# Patient Record
Sex: Male | Born: 1975 | Race: White | Marital: Married | Smoking: Former smoker
Health system: Northeastern US, Academic
[De-identification: ages and names within clinical notes are randomized; demographics above are authoritative.]

## PROBLEM LIST (undated history)

## (undated) DIAGNOSIS — G809 Cerebral palsy, unspecified: Secondary | ICD-10-CM

## (undated) DIAGNOSIS — M1990 Unspecified osteoarthritis, unspecified site: Secondary | ICD-10-CM

## (undated) DIAGNOSIS — K219 Gastro-esophageal reflux disease without esophagitis: Secondary | ICD-10-CM

## (undated) DIAGNOSIS — E785 Hyperlipidemia, unspecified: Secondary | ICD-10-CM

## (undated) DIAGNOSIS — J45909 Unspecified asthma, uncomplicated: Secondary | ICD-10-CM

## (undated) DIAGNOSIS — Z9889 Other specified postprocedural states: Secondary | ICD-10-CM

## (undated) DIAGNOSIS — G8929 Other chronic pain: Secondary | ICD-10-CM

## (undated) DIAGNOSIS — R112 Nausea with vomiting, unspecified: Secondary | ICD-10-CM

## (undated) HISTORY — PX: PYELOPLASTY: SUR1061

## (undated) HISTORY — PX: LEG SURGERY: SHX1003

## (undated) HISTORY — PX: KIDNEY SURGERY: SHX687

## (undated) HISTORY — DX: Unspecified asthma, uncomplicated: J45.909

## (undated) HISTORY — DX: Unspecified osteoarthritis, unspecified site: M19.90

## (undated) HISTORY — DX: Gastro-esophageal reflux disease without esophagitis: K21.9

## (undated) HISTORY — DX: Hyperlipidemia, unspecified: E78.5

## (undated) HISTORY — PX: HIP REPLACEMENT: SHX530A

---

## 2011-01-29 HISTORY — PX: KIDNEY SURGERY: SHX687

## 2014-08-08 ENCOUNTER — Emergency Department
Admission: EM | Admit: 2014-08-08 | Discharge: 2014-08-08 | Disposition: A | Discharge status: Home / Self Care | Payer: BLUE CROSS/BLUE SHIELD | Attending: Emergency Medicine | Admitting: Emergency Medicine

## 2014-08-08 ENCOUNTER — Encounter: Payer: Self-pay | Admitting: *Deleted

## 2014-08-08 DIAGNOSIS — Z87891 Personal history of nicotine dependence: Secondary | ICD-10-CM | POA: Diagnosis not present

## 2014-08-08 DIAGNOSIS — G809 Cerebral palsy, unspecified: Secondary | ICD-10-CM | POA: Insufficient documentation

## 2014-08-08 DIAGNOSIS — Z79899 Other long term (current) drug therapy: Secondary | ICD-10-CM | POA: Diagnosis not present

## 2014-08-08 DIAGNOSIS — M25551 Pain in right hip: Secondary | ICD-10-CM | POA: Diagnosis present

## 2014-08-08 HISTORY — DX: Unspecified osteoarthritis, unspecified site: M19.90

## 2014-08-08 HISTORY — DX: Cerebral palsy, unspecified: G80.9

## 2014-08-08 HISTORY — DX: Other chronic pain: G89.29

## 2014-08-08 MED ORDER — OXYCODONE-ACETAMINOPHEN 5-325 MG PO TABS
2.0000 | ORAL_TABLET | Freq: Once | ORAL | Status: AC
  Administered 2014-08-08: 2 via ORAL
  Filled 2014-08-08: qty 2

## 2014-08-08 MED ORDER — OXYCODONE-ACETAMINOPHEN 5-325 MG PO TABS: 1.0000 | ORAL_TABLET | ORAL | Status: DC | PRN

## 2014-08-08 NOTE — ED Notes (Signed)
Pt admits to hx of osteoarthritis and chronic pain to hip joints - pt has been experiencing acute on chronic pain exacerbation since Saturday, pt states home pain medications have not been effective. Denies any mechanism of injury.

## 2014-08-08 NOTE — ED Notes (Signed)
Colton MassedKaminski, MD at bedside.

## 2014-08-08 NOTE — ED Provider Notes (Signed)
[facility] Emergency Department Provider Note  ____________________________________________  Time seen: 2225  I have reviewed the triage vital signs and the nursing notes.   HISTORY  Chief Complaint Hip Pain     HPI Colton [name] is a 39 y.o. male who was born with cerebral palsy. He has had a lifelong problem with some of his musculoskeletal issues. This included surgeries or very young age on his right leg. He has developed osteoarthritis and has a chronic pain problem. He goes to Dr. Ricardo Jericho, the physiatrist at [facility] and he has seen Dr. Ernest Pine, orthopedics, there is as well.  The patient is reporting a pain flare that began approximately 4 days ago. He has decreased ability to pursue his usual daily activities. He is needing additional assistance when he is up and ambulating.  He used to take cyclobenzaprine and Tylenol fours for control of spasm and pain. He did have some oxycodone and hydrocodone left over from past events but these are all gone. He asked his physicians at [facility] clinic to help with a stronger pain medication but they reportedly said that they were unable to do this and that if he had further pain he would need to go to the emergency department.  The patient is fairly knowledgeable about his own condition. He has been referred to [facility] for ongoing care and possible resurfacing of his hip.    Past Medical History  Diagnosis Date  . Cerebral palsy   . Osteoarthritis   . Chronic pain     There are no active problems to display for this patient.   Past Surgical History  Procedure Laterality Date  . Leg surgery    . Pyeloplasty      Current Outpatient Rx  Name  Route  Sig  Dispense  Refill  . acetaminophen-codeine (TYLENOL #3) 300-30 MG per tablet   Oral   Take 1 tablet by mouth every 4 (four) hours as needed for moderate pain.         Marland Kitchen albuterol (PROVENTIL HFA;VENTOLIN HFA) 108 (90 BASE) MCG/ACT  inhaler   Inhalation   Inhale 2 puffs into the lungs every 6 (six) hours as needed for wheezing or shortness of breath.         . cetirizine (ZYRTEC) 10 MG tablet   Oral   Take 10 mg by mouth daily.         . cyclobenzaprine (FLEXERIL) 10 MG tablet   Oral   Take 10 mg by mouth 3 (three) times daily as needed for muscle spasms.         Marland Kitchen oxyCODONE-acetaminophen (ROXICET) 5-325 MG per tablet   Oral   Take 1 tablet by mouth every 4 (four) hours as needed for severe pain.   16 tablet   0     Allergies Review of patient's allergies indicates no known allergies.  History reviewed. No pertinent family history.  Social History History  Substance Use Topics  . Smoking status: Former Smoker    Quit date: 08/07/2013  . Smokeless tobacco: Not on file  . Alcohol Use: Yes    Review of Systems  Constitutional: Negative for fever. ENT: Negative for sore throat. Cardiovascular: Negative for chest pain. Respiratory: Negative for shortness of breath. Gastrointestinal: Negative for abdominal pain, vomiting and diarrhea. Genitourinary: Negative for dysuria. Musculoskeletal: Pain in right hip. See history of present illness  Skin: Negative for rash. Neurological: Negative for headaches   10-point ROS otherwise negative.  ____________________________________________  PHYSICAL EXAM:  VITAL SIGNS: ED Triage Vitals  Enc Vitals Group     BP 08/08/14 2210 138/94 mmHg     Pulse Rate 08/08/14 2210 90     Resp 08/08/14 2210 16     Temp 08/08/14 2210 98.4 F (36.9 C)     Temp Source 08/08/14 2210 Oral     SpO2 08/08/14 2210 94 %     Weight 08/08/14 2210 160 lb (72.576 kg)     Height 08/08/14 2210 5\' 6"  (1.676 m)     Head Cir --      Peak Flow --      Pain Score 08/08/14 2210 8     Pain Loc --      Pain Edu? --      Excl. in GC? --     Constitutional:  Alert and oriented. Appears a little bit uncomfortable and is pleasant but somewhat frustrated with his pain and  situation. Cardiovascular: Normal rate, regular rhythm, no murmur noted Respiratory:  Normal respiratory effort, no tachypnea.    Breath sounds are clear and equal bilaterally.  Gastrointestinal: Soft and nontender. No distention.  Back: No muscle spasm, no tenderness, no CVA tenderness. Musculoskeletal: No deformity. Patient does have tenderness in the right hip. Neurologic:  Normal speech and language. Patient does have a degree of palsy in his legs. Skin:  Skin is warm, dry. No rash noted. Psychiatric: Mood and affect are normal. Speech and behavior are normal.  ____________________________________________    ____________________________________________   INITIAL IMPRESSION / ASSESSMENT AND PLAN / ED COURSE  Pertinent labs & imaging results that were available during my care of the patient were reviewed by me and considered in my medical decision making (see chart for details).  Pleasant, knowledgeable 39 year old male with an acute on chronic problem that [facility]. We will prescribe Percocet for his current pain situation. He does have an appointment to see Dr. Gladstone Lighterhesness is coming up soon for a repeat injection into his right hip.  ____________________________________________   FINAL CLINICAL IMPRESSION(S) / ED DIAGNOSES  Final diagnoses:  Hip pain, acute, right  Cerebral palsy   osteoarthritis    Darien Ramusavid W Lucianna Ostlund, MD 08/08/14 2248

## 2014-08-08 NOTE — Discharge Instructions (Signed)
Take Percocet if needed for your pain. Follow-up with Dr. Ernest PineHooten and discuss long-term pain management treatment.  Return to the emergency department if you have worsening pain, fevers, or other urgent concerns.

## 2014-08-16 ENCOUNTER — Encounter: Payer: Self-pay | Admitting: Pain Medicine

## 2014-08-16 ENCOUNTER — Ambulatory Visit: Payer: BLUE CROSS/BLUE SHIELD | Attending: Pain Medicine | Admitting: Pain Medicine

## 2014-08-16 VITALS — BP 126/76 | HR 79 | Temp 98.2°F | Resp 16 | Ht 66.0 in | Wt 160.0 lb

## 2014-08-16 DIAGNOSIS — J45909 Unspecified asthma, uncomplicated: Secondary | ICD-10-CM | POA: Insufficient documentation

## 2014-08-16 DIAGNOSIS — M1611 Unilateral primary osteoarthritis, right hip: Secondary | ICD-10-CM

## 2014-08-16 DIAGNOSIS — M25551 Pain in right hip: Secondary | ICD-10-CM | POA: Diagnosis present

## 2014-08-16 DIAGNOSIS — M533 Sacrococcygeal disorders, not elsewhere classified: Secondary | ICD-10-CM | POA: Diagnosis not present

## 2014-08-16 DIAGNOSIS — K219 Gastro-esophageal reflux disease without esophagitis: Secondary | ICD-10-CM | POA: Insufficient documentation

## 2014-08-16 DIAGNOSIS — M1610 Unilateral primary osteoarthritis, unspecified hip: Secondary | ICD-10-CM | POA: Diagnosis not present

## 2014-08-16 DIAGNOSIS — G809 Cerebral palsy, unspecified: Secondary | ICD-10-CM | POA: Insufficient documentation

## 2014-08-16 DIAGNOSIS — M160 Bilateral primary osteoarthritis of hip: Secondary | ICD-10-CM

## 2014-08-16 DIAGNOSIS — M169 Osteoarthritis of hip, unspecified: Secondary | ICD-10-CM | POA: Insufficient documentation

## 2014-08-16 NOTE — Patient Instructions (Addendum)
Continue present medications  Hip injection Wednesday, 08/24/2014  F/U PCP Dr Larwance SachsBabaoff  for evaliation of  BP and general medical  condition.  F/U surgical evaluation with Dr. Ernest PineHooten and Dr. Corky Downslsen as discussed  F/U neurological evaluation  F/U Dr Yves Dillhasnis   May consider radiofrequency rhizolysis or intraspinal procedures pending response to present treatment and F/U evaluation.  Patient to call [facility] should patient have concerns prior to scheduled return appointment.  Do not eat 6 hours before your procedure. You may have clear liquids and your morning medications 2 hours before your procedure. Bring a responsible driver.

## 2014-08-16 NOTE — Progress Notes (Signed)
Safety precautions to be maintained throughout the outpatient stay will include: orient to surroundings, keep bed in low position, maintain call bell within reach at all times, provide assistance with transfer out of bed and ambulation.  Went to ED on 08-08-14 for pain in the right hip because ran out of strong pain meds. Given Percocet.

## 2014-08-16 NOTE — Progress Notes (Signed)
Subjective:    Patient ID: Colton [name], male    DOB: 1975-11-19, 39 y.o.   MRN: [id number]  HPI  Patient is 39 year old gentleman who comes to pain management Center questionnaire of Dr. Ernest [name] for further evaluation and treatment of pain involving the region of the right hip predominantly. Patient is with history of cerebral palsy and has had pain of the region of the hip for several years. Patient states that he would like to minimize use of medications and that them for to his evaluation of the hip with Dr. Corky [name] at [facility] orthopedic department . Today's visit the patient described his pain as aching cramping dull sharp shooting sensation . The patient stated the pain awakens him from sleep and interferes with ability to go to sleep patient states the pain is decreased in the past with hot packs warm showers and baths and medications. Patient states that he is undergone prior injections in the region of the hip with some relief of pain. Patient states that at times his pain becomes quite severe and recently patient had to go to the emergency room for treatment of his severe pain. We informed patient that we would try to minimize or eliminate the need for emergency room visits and will consider patient for modifications of medications as well as interventional treatment. We will await further evaluation of patient and we'll proceed with patient and treatment of patient pending follow-up evaluations. The patient was understanding and agree with suggested treatment plan.        Review of Systems   Cardiovascular Unremarkable  Pulmonary Asthma  Neurological  Unremarkable   Psychological   Unremarkable  Gastrointestinal Gastroesophageal reflux disease Constipation  Genitourinary Unremarkable  Hematological Unremarkable  Endocrine Unremarkable  Rheumatological Osteoarthritis  Musculoskeletal Unremarkable  Other  significant Unremarkable               Physical Exam  There was tenderness to palpation of paraspinal musculature region cervical region cervical facet region a minimal degree. No new lesions of the head and neck were noted. There was tenderness of the cervical facet cervical paraspinal musculature region on the left as well as on the right. There was minimal tenderness of the splenius capitis and occipitalis musculature regions. Palpation of the thoracic facet thoracic paraspinal musculature region was with minimal discomfort. There was no crepitus of the thoracic region noted. Palpation over the region of the cranial clavicular and glenohumeral joint regions was without increased pain of any significant degree. Patient appeared to be with bilaterally equal grip strength and Tinel and Phalen's maneuver were without increase of pain of significant degree. Palpation over the lumbar paraspinal musculature region lumbar facet region as well as the lower thoracic paraspinal musculature region was with evidence of significant muscle spasms. There was tenderness over the PSIS and PII S region palpation which reproduces moderate discomfort. There was increased pain with Patrick's maneuver performed on the right. Straight leg raising was tolerates approximately 30. There was mild tenderness of the greater trochanteric region iliotibial band region. No definite sensory deficit of dermatomal description was detected. The knee was without increased warmth and erythema and there was no ballottement of the patella. There was negative anterior and posterior drawer signs. There was mild increased pain with pressure prior to the ilium with patient in lateral decubitus position. Abdomen nontender and no costovertebral angle tenderness noted.        Assessment & Plan:    Degenerative joint disease of hip  Cerebral palsy   Sacroiliac joint dysfunction   Plan   Continue present medications for now.  Pending further evaluation modification of medications will be considered  Hip injection to be performed at time return appointment  [facility]  BP and general medical  condition.  F/U surgical evaluation with Dr. Ernest PineHooten and surgical evaluation [facility]slsen [facility] as planned  F/U neurological evaluation  Follow-up Dr. Yves Dillhasnis   Psych evaluation to be considered  May consider radiofrequency rhizolysis or intraspinal procedures pending response to present treatment and F/U evaluation.  Patient to call Pain Management Center should patient have concerns prior to scheduled return appointment.

## 2014-08-24 ENCOUNTER — Ambulatory Visit: Payer: BLUE CROSS/BLUE SHIELD | Admitting: Pain Medicine

## 2014-08-25 ENCOUNTER — Other Ambulatory Visit: Payer: Self-pay | Admitting: Pain Medicine

## 2014-10-19 ENCOUNTER — Encounter
Admission: RE | Admit: 2014-10-19 | Discharge: 2014-10-19 | Disposition: A | Discharge status: Home / Self Care | Payer: BLUE CROSS/BLUE SHIELD | Source: Ambulatory Visit | Attending: Orthopedic Surgery | Admitting: Orthopedic Surgery

## 2014-10-19 DIAGNOSIS — M1611 Unilateral primary osteoarthritis, right hip: Secondary | ICD-10-CM | POA: Insufficient documentation

## 2014-10-19 DIAGNOSIS — Z01818 Encounter for other preprocedural examination: Secondary | ICD-10-CM | POA: Insufficient documentation

## 2014-10-19 LAB — URINALYSIS COMPLETE WITH MICROSCOPIC (ARMC ONLY)
Bilirubin Urine: NEGATIVE
Glucose, UA: NEGATIVE mg/dL
KETONES UR: NEGATIVE mg/dL
LEUKOCYTES UA: NEGATIVE
NITRITE: NEGATIVE
PROTEIN: NEGATIVE mg/dL
SPECIFIC GRAVITY, URINE: 1.023 (ref 1.005–1.030)
Squamous Epithelial / LPF: NONE SEEN
pH: 6 (ref 5.0–8.0)

## 2014-10-19 LAB — SEDIMENTATION RATE: Sed Rate: 6 mm/hr (ref 0–16)

## 2014-10-19 LAB — BASIC METABOLIC PANEL
Anion gap: 9 (ref 5–15)
BUN: 16 mg/dL (ref 6–20)
CHLORIDE: 105 mmol/L (ref 101–111)
CO2: 22 mmol/L (ref 22–32)
Calcium: 9.6 mg/dL (ref 8.9–10.3)
Creatinine, Ser: 0.88 mg/dL (ref 0.61–1.24)
GFR calc Af Amer: >60 mL/min (ref >60)
GFR calc non Af Amer: >60 mL/min (ref >60)
GLUCOSE: 93 mg/dL (ref 65–99)
Potassium: 4.1 mmol/L (ref 3.5–5.1)
SODIUM: 136 mmol/L (ref 135–145)

## 2014-10-19 LAB — CBC
HEMATOCRIT: 46.6 % (ref 40.0–52.0)
Hemoglobin: 15.2 g/dL (ref 13.0–18.0)
MCH: 28.7 pg (ref 26.0–34.0)
MCHC: 32.8 g/dL (ref 32.0–36.0)
MCV: 87.6 fL (ref 80.0–100.0)
Platelets: 231 10*3/uL (ref 150–440)
RBC: 5.31 MIL/uL (ref 4.40–5.90)
RDW: 12.7 % (ref 11.5–14.5)
WBC: 6.9 10*3/uL (ref 3.8–10.6)

## 2014-10-19 LAB — ABO/RH: ABO/RH(D): O POS

## 2014-10-19 LAB — PROTIME-INR
INR: 0.91
PROTHROMBIN TIME: 12.5 s (ref 11.4–15.0)

## 2014-10-19 LAB — TYPE AND SCREEN
ABO/RH(D): O POS
Antibody Screen: NEGATIVE

## 2014-10-19 LAB — APTT: aPTT: 30 seconds (ref 24–36)

## 2014-10-19 LAB — SURGICAL PCR SCREEN
MRSA, PCR: NEGATIVE
Staphylococcus aureus: NEGATIVE

## 2014-10-19 NOTE — Patient Instructions (Signed)
  Your procedure is scheduled on: Monday 10/31/2014 Report to Day Surgery. 2ND FLOOR MEDICAL MALL ENTRANCE To find out your arrival time please call [phone] between 1PM - 3PM on Friday 10/28/2014.  Remember: Instructions that are not followed completely may result in serious medical risk, up to and including death, or upon the discretion of your surgeon and anesthesiologist your surgery may need to be rescheduled.    __X__ 1. Do not eat food or drink liquids after midnight. No gum chewing or hard candies.     __X__ 2. No Alcohol for 24 hours before or after surgery.   ____ 3. Bring all medications with you on the day of surgery if instructed.    __X__ 4. Notify your doctor if there is any change in your medical condition     (cold, fever, infections).     Do not wear jewelry, make-up, hairpins, clips or nail polish.  Do not wear lotions, powders, or perfumes.   Do not shave 48 hours prior to surgery. Men may shave face and neck.  Do not bring valuables to the hospital.    [facility] is not responsible for any belongings or valuables.               Contacts, dentures or bridgework may not be worn into surgery.  Leave your suitcase in the car. After surgery it may be brought to your room.  For patients admitted to the hospital, discharge time is determined by your                treatment team.   Patients discharged the day of surgery will not be allowed to drive home.   Please read over the following fact sheets that you were given:   MRSA Information and Surgical Site Infection Prevention   __X__ Take these medicines the morning of surgery with A SIP OF WATER:    1. TAKE OXYCODONE IF NEEDED  2. USE YOUR INHALER AND BRING IT WITH YOU TO THE HOSPITAL  3.   4.  5.  6.  ____ Fleet Enema (as directed)   __X__ Use CHG Soap as directed  __X__ Use inhalers on the day of surgery  ____ Stop metformin 2 days prior to surgery    ____ Take 1/2 of usual insulin dose the night  before surgery and none on the morning of surgery.   ____ Stop Coumadin/Plavix/aspirin on   __X__ Stop Anti-inflammatories  ALEVE 7 DAYS PRIOR TO SURGERY   ____ Stop supplements until after surgery.    ____ Bring C-Pap to the hospital.

## 2014-10-20 LAB — URINE CULTURE: CULTURE: NO GROWTH

## 2014-10-31 ENCOUNTER — Inpatient Hospital Stay
Admission: RE | Admit: 2014-10-31 | Discharge: 2014-11-03 | DRG: 470 | Disposition: A | Discharge status: Home / Self Care | Payer: BLUE CROSS/BLUE SHIELD | Source: Ambulatory Visit | Attending: Orthopedic Surgery | Admitting: Orthopedic Surgery

## 2014-10-31 ENCOUNTER — Inpatient Hospital Stay: Payer: BLUE CROSS/BLUE SHIELD | Admitting: Anesthesiology

## 2014-10-31 ENCOUNTER — Encounter: Payer: Self-pay | Admitting: *Deleted

## 2014-10-31 ENCOUNTER — Inpatient Hospital Stay: Payer: BLUE CROSS/BLUE SHIELD

## 2014-10-31 ENCOUNTER — Encounter
Admission: RE | Disposition: A | Discharge status: Home / Self Care | Payer: Self-pay | Source: Ambulatory Visit | Attending: Orthopedic Surgery

## 2014-10-31 DIAGNOSIS — Z96649 Presence of unspecified artificial hip joint: Secondary | ICD-10-CM

## 2014-10-31 DIAGNOSIS — G8929 Other chronic pain: Secondary | ICD-10-CM | POA: Diagnosis present

## 2014-10-31 DIAGNOSIS — Q6589 Other specified congenital deformities of hip: Secondary | ICD-10-CM | POA: Diagnosis not present

## 2014-10-31 DIAGNOSIS — G809 Cerebral palsy, unspecified: Secondary | ICD-10-CM | POA: Diagnosis present

## 2014-10-31 DIAGNOSIS — M1611 Unilateral primary osteoarthritis, right hip: Principal | ICD-10-CM | POA: Diagnosis present

## 2014-10-31 DIAGNOSIS — Z833 Family history of diabetes mellitus: Secondary | ICD-10-CM

## 2014-10-31 DIAGNOSIS — J45909 Unspecified asthma, uncomplicated: Secondary | ICD-10-CM | POA: Diagnosis present

## 2014-10-31 DIAGNOSIS — Z825 Family history of asthma and other chronic lower respiratory diseases: Secondary | ICD-10-CM | POA: Diagnosis not present

## 2014-10-31 DIAGNOSIS — Z79899 Other long term (current) drug therapy: Secondary | ICD-10-CM

## 2014-10-31 DIAGNOSIS — Z9889 Other specified postprocedural states: Secondary | ICD-10-CM

## 2014-10-31 DIAGNOSIS — Z8249 Family history of ischemic heart disease and other diseases of the circulatory system: Secondary | ICD-10-CM | POA: Diagnosis not present

## 2014-10-31 HISTORY — DX: Nausea with vomiting, unspecified: R11.2

## 2014-10-31 HISTORY — DX: Other specified postprocedural states: Z98.890

## 2014-10-31 HISTORY — PX: TOTAL HIP ARTHROPLASTY: SHX124

## 2014-10-31 SURGERY — ARTHROPLASTY, HIP, TOTAL,POSTERIOR APPROACH
Anesthesia: Spinal | Laterality: Right

## 2014-10-31 MED ORDER — FUROSEMIDE 10 MG/ML IJ SOLN
INTRAMUSCULAR | Status: DC | PRN
  Administered 2014-10-31: 10 mg via INTRAMUSCULAR

## 2014-10-31 MED ORDER — MIDAZOLAM HCL 5 MG/5ML IJ SOLN
INTRAMUSCULAR | Status: DC | PRN
  Administered 2014-10-31: 2 mg via INTRAVENOUS

## 2014-10-31 MED ORDER — FAMOTIDINE 20 MG PO TABS
20.0000 mg | ORAL_TABLET | Freq: Once | ORAL | Status: AC
  Administered 2014-10-31: 20 mg via ORAL

## 2014-10-31 MED ORDER — PROPOFOL 10 MG/ML IV BOLUS
INTRAVENOUS | Status: DC | PRN
  Administered 2014-10-31: 30 mg via INTRAVENOUS
  Administered 2014-10-31: 20 mg via INTRAVENOUS

## 2014-10-31 MED ORDER — OXYCODONE HCL 5 MG PO TABS
5.0000 mg | ORAL_TABLET | ORAL | Status: DC | PRN
  Administered 2014-10-31: 5 mg via ORAL
  Administered 2014-10-31 – 2014-11-01 (×7): 10 mg via ORAL
  Administered 2014-11-02: 5 mg via ORAL
  Administered 2014-11-02 (×4): 10 mg via ORAL
  Administered 2014-11-03: 5 mg via ORAL
  Filled 2014-10-31 (×3): qty 2
  Filled 2014-10-31: qty 1
  Filled 2014-10-31 (×4): qty 2
  Filled 2014-10-31: qty 1
  Filled 2014-10-31 (×3): qty 2
  Filled 2014-10-31: qty 1
  Filled 2014-10-31 (×2): qty 2

## 2014-10-31 MED ORDER — LACTATED RINGERS IV SOLN
INTRAVENOUS | Status: DC
  Administered 2014-10-31 (×4): via INTRAVENOUS

## 2014-10-31 MED ORDER — PHENOL 1.4 % MT LIQD: 1.0000 | OROMUCOSAL | Status: DC | PRN

## 2014-10-31 MED ORDER — PHENYLEPHRINE HCL 10 MG/ML IJ SOLN
INTRAMUSCULAR | Status: DC | PRN
  Administered 2014-10-31: 100 ug via INTRAVENOUS
  Administered 2014-10-31: 200 ug via INTRAVENOUS
  Administered 2014-10-31 (×7): 100 ug via INTRAVENOUS

## 2014-10-31 MED ORDER — INFLUENZA VAC SPLIT QUAD 0.5 ML IM SUSY
0.5000 mL | PREFILLED_SYRINGE | INTRAMUSCULAR | Status: AC
  Administered 2014-11-01: 0.5 mL via INTRAMUSCULAR
  Filled 2014-10-31: qty 0.5

## 2014-10-31 MED ORDER — CYCLOBENZAPRINE HCL 10 MG PO TABS
5.0000 mg | ORAL_TABLET | Freq: Three times a day (TID) | ORAL | Status: DC
Start: 2014-10-31 — End: 2014-11-03
  Administered 2014-10-31 – 2014-11-03 (×9): 5 mg via ORAL
  Filled 2014-10-31 (×9): qty 1

## 2014-10-31 MED ORDER — ACETAMINOPHEN 10 MG/ML IV SOLN
1000.0000 mg | Freq: Four times a day (QID) | INTRAVENOUS | Status: AC
  Administered 2014-10-31 – 2014-11-01 (×4): 1000 mg via INTRAVENOUS
  Filled 2014-10-31 (×4): qty 100

## 2014-10-31 MED ORDER — FENTANYL CITRATE (PF) 100 MCG/2ML IJ SOLN
25.0000 ug | INTRAMUSCULAR | Status: DC | PRN
  Administered 2014-10-31 (×2): 25 ug via INTRAVENOUS

## 2014-10-31 MED ORDER — SODIUM CHLORIDE 0.9 % IV SOLN: 10000.0000 ug | INTRAVENOUS | Status: DC | PRN

## 2014-10-31 MED ORDER — TRAMADOL HCL 50 MG PO TABS: 50.0000 mg | ORAL_TABLET | ORAL | Status: DC | PRN

## 2014-10-31 MED ORDER — FENTANYL CITRATE (PF) 100 MCG/2ML IJ SOLN
INTRAMUSCULAR | Status: AC
  Administered 2014-10-31: 25 ug via INTRAVENOUS
  Filled 2014-10-31: qty 2

## 2014-10-31 MED ORDER — KETAMINE HCL 50 MG/ML IJ SOLN
INTRAMUSCULAR | Status: DC | PRN
  Administered 2014-10-31: 50 mg via INTRAMUSCULAR

## 2014-10-31 MED ORDER — CEFAZOLIN SODIUM-DEXTROSE 2-3 GM-% IV SOLR
2.0000 g | Freq: Four times a day (QID) | INTRAVENOUS | Status: AC
  Administered 2014-10-31 – 2014-11-01 (×4): 2 g via INTRAVENOUS
  Filled 2014-10-31 (×4): qty 50

## 2014-10-31 MED ORDER — CEFAZOLIN SODIUM-DEXTROSE 2-3 GM-% IV SOLR
2.0000 g | Freq: Once | INTRAVENOUS | Status: AC
  Administered 2014-10-31: 2 g via INTRAVENOUS

## 2014-10-31 MED ORDER — ACETAMINOPHEN 325 MG PO TABS: 650.0000 mg | ORAL_TABLET | Freq: Four times a day (QID) | ORAL | Status: DC | PRN

## 2014-10-31 MED ORDER — PROPOFOL 500 MG/50ML IV EMUL
INTRAVENOUS | Status: DC | PRN
  Administered 2014-10-31: 80 ug/kg/min via INTRAVENOUS

## 2014-10-31 MED ORDER — SODIUM CHLORIDE 0.9 % IV SOLN
INTRAVENOUS | Status: DC
  Administered 2014-10-31 – 2014-11-01 (×2): via INTRAVENOUS

## 2014-10-31 MED ORDER — DEXAMETHASONE SODIUM PHOSPHATE 10 MG/ML IJ SOLN
INTRAMUSCULAR | Status: DC | PRN
  Administered 2014-10-31: 5 mg via INTRAVENOUS

## 2014-10-31 MED ORDER — ALUM & MAG HYDROXIDE-SIMETH 200-200-20 MG/5ML PO SUSP: 30.0000 mL | ORAL | Status: DC | PRN

## 2014-10-31 MED ORDER — ACETAMINOPHEN 10 MG/ML IV SOLN
INTRAVENOUS | Status: DC | PRN
  Administered 2014-10-31: 1000 mg via INTRAVENOUS

## 2014-10-31 MED ORDER — ALBUTEROL SULFATE (2.5 MG/3ML) 0.083% IN NEBU: 2.5000 mg | INHALATION_SOLUTION | Freq: Four times a day (QID) | RESPIRATORY_TRACT | Status: DC | PRN

## 2014-10-31 MED ORDER — ONDANSETRON HCL 4 MG/2ML IJ SOLN
4.0000 mg | Freq: Four times a day (QID) | INTRAMUSCULAR | Status: DC | PRN
  Administered 2014-11-01: 4 mg via INTRAVENOUS
  Filled 2014-10-31: qty 2

## 2014-10-31 MED ORDER — METOCLOPRAMIDE HCL 10 MG PO TABS
10.0000 mg | ORAL_TABLET | Freq: Three times a day (TID) | ORAL | Status: AC
  Administered 2014-10-31 – 2014-11-02 (×8): 10 mg via ORAL
  Filled 2014-10-31 (×8): qty 1

## 2014-10-31 MED ORDER — FERROUS SULFATE 325 (65 FE) MG PO TABS
325.0000 mg | ORAL_TABLET | Freq: Two times a day (BID) | ORAL | Status: DC
  Administered 2014-10-31 – 2014-11-03 (×6): 325 mg via ORAL
  Filled 2014-10-31 (×6): qty 1

## 2014-10-31 MED ORDER — BUPIVACAINE HCL (PF) 0.5 % IJ SOLN
INTRAMUSCULAR | Status: DC | PRN
  Administered 2014-10-31: 3 mL

## 2014-10-31 MED ORDER — MENTHOL 3 MG MT LOZG
1.0000 | LOZENGE | OROMUCOSAL | Status: DC | PRN
Start: 2014-10-31 — End: 2014-11-03

## 2014-10-31 MED ORDER — ALBUTEROL SULFATE HFA 108 (90 BASE) MCG/ACT IN AERS: 2.0000 | INHALATION_SPRAY | Freq: Four times a day (QID) | RESPIRATORY_TRACT | Status: DC | PRN

## 2014-10-31 MED ORDER — SENNOSIDES-DOCUSATE SODIUM 8.6-50 MG PO TABS
1.0000 | ORAL_TABLET | Freq: Two times a day (BID) | ORAL | Status: DC
  Administered 2014-10-31 – 2014-11-03 (×7): 1 via ORAL
  Filled 2014-10-31 (×7): qty 1

## 2014-10-31 MED ORDER — ONDANSETRON HCL 4 MG/2ML IJ SOLN: 4.0000 mg | Freq: Once | INTRAMUSCULAR | Status: DC | PRN

## 2014-10-31 MED ORDER — ENOXAPARIN SODIUM 30 MG/0.3ML ~~LOC~~ SOLN
30.0000 mg | Freq: Two times a day (BID) | SUBCUTANEOUS | Status: DC
  Administered 2014-11-01 – 2014-11-03 (×5): 30 mg via SUBCUTANEOUS
  Filled 2014-10-31 (×5): qty 0.3

## 2014-10-31 MED ORDER — ONDANSETRON HCL 4 MG PO TABS: 4.0000 mg | ORAL_TABLET | Freq: Four times a day (QID) | ORAL | Status: DC | PRN

## 2014-10-31 MED ORDER — FLEET ENEMA 7-19 GM/118ML RE ENEM: 1.0000 | ENEMA | Freq: Once | RECTAL | Status: DC | PRN

## 2014-10-31 MED ORDER — DIPHENHYDRAMINE HCL 12.5 MG/5ML PO ELIX: 12.5000 mg | ORAL_SOLUTION | ORAL | Status: DC | PRN

## 2014-10-31 MED ORDER — MORPHINE SULFATE (PF) 2 MG/ML IV SOLN
2.0000 mg | INTRAVENOUS | Status: DC | PRN
  Administered 2014-10-31 – 2014-11-01 (×5): 2 mg via INTRAVENOUS
  Filled 2014-10-31 (×5): qty 1

## 2014-10-31 MED ORDER — ACETAMINOPHEN 650 MG RE SUPP: 650.0000 mg | Freq: Four times a day (QID) | RECTAL | Status: DC | PRN

## 2014-10-31 MED ORDER — ONDANSETRON HCL 4 MG/2ML IJ SOLN
INTRAMUSCULAR | Status: DC | PRN
  Administered 2014-10-31: 4 mg via INTRAVENOUS

## 2014-10-31 MED ORDER — TRANEXAMIC ACID 1000 MG/10ML IV SOLN
1000.0000 mg | INTRAVENOUS | Status: AC
  Administered 2014-10-31: 1000 mg via INTRAVENOUS
  Filled 2014-10-31: qty 10

## 2014-10-31 MED ORDER — BISACODYL 10 MG RE SUPP
10.0000 mg | Freq: Every day | RECTAL | Status: DC | PRN
  Administered 2014-11-02: 10 mg via RECTAL
  Filled 2014-10-31: qty 1

## 2014-10-31 MED ORDER — LORATADINE 10 MG PO TABS
10.0000 mg | ORAL_TABLET | Freq: Every day | ORAL | Status: DC
  Administered 2014-11-01 – 2014-11-03 (×3): 10 mg via ORAL
  Filled 2014-10-31 (×3): qty 1

## 2014-10-31 MED ORDER — SODIUM CHLORIDE 0.9 % IV SOLN
1000.0000 mg | Freq: Once | INTRAVENOUS | Status: AC
  Administered 2014-10-31: 1000 mg via INTRAVENOUS
  Filled 2014-10-31: qty 10

## 2014-10-31 MED ORDER — MAGNESIUM HYDROXIDE 400 MG/5ML PO SUSP
30.0000 mL | Freq: Every day | ORAL | Status: DC | PRN
  Administered 2014-11-02: 30 mL via ORAL
  Filled 2014-10-31: qty 30

## 2014-10-31 SURGICAL SUPPLY — 52 items
BLADE DRUM FLTD (BLADE) ×3 IMPLANT
BLADE SAW 1 (BLADE) ×3 IMPLANT
CANISTER SUCT 1200ML W/VALVE (MISCELLANEOUS) ×3 IMPLANT
CANISTER SUCT 3000ML (MISCELLANEOUS) ×6 IMPLANT
CAPT HIP TOTAL 2 ×3 IMPLANT
CARTRIDGE OIL MAESTRO DRILL (MISCELLANEOUS) ×1 IMPLANT
CATH FOL LEG HOLDER (MISCELLANEOUS) ×3 IMPLANT
CATH TRAY METER 16FR LF (MISCELLANEOUS) ×3 IMPLANT
DIFFUSER MAESTRO (MISCELLANEOUS) ×3 IMPLANT
DRAPE INCISE IOBAN 66X60 STRL (DRAPES) ×3 IMPLANT
DRAPE SHEET LG 3/4 BI-LAMINATE (DRAPES) ×3 IMPLANT
DRAPE TABLE BACK 80X90 (DRAPES) ×3 IMPLANT
DRSG DERMACEA 8X12 NADH (GAUZE/BANDAGES/DRESSINGS) ×3 IMPLANT
DRSG OPSITE POSTOP 3X4 (GAUZE/BANDAGES/DRESSINGS) ×3 IMPLANT
DRSG OPSITE POSTOP 4X12 (GAUZE/BANDAGES/DRESSINGS) ×3 IMPLANT
DRSG OPSITE POSTOP 4X14 (GAUZE/BANDAGES/DRESSINGS) ×3 IMPLANT
DRSG TEGADERM 4X4.75 (GAUZE/BANDAGES/DRESSINGS) ×3 IMPLANT
DURAPREP 26ML APPLICATOR (WOUND CARE) ×3 IMPLANT
ELECT BLADE 6.5 EXT (BLADE) ×3 IMPLANT
ELECT CAUTERY BLADE 6.4 (BLADE) ×3 IMPLANT
GLOVE BIOGEL M STRL SZ7.5 (GLOVE) ×3 IMPLANT
GLOVE INDICATOR 8.0 STRL GRN (GLOVE) ×3 IMPLANT
GLOVE SURG 9.0 ORTHO LTXF (GLOVE) ×3 IMPLANT
GLOVE SURG ORTHO 9.0 STRL STRW (GLOVE) ×3 IMPLANT
GOWN STRL REUS W/ TWL LRG LVL3 (GOWN DISPOSABLE) ×1 IMPLANT
GOWN STRL REUS W/ TWL LRG LVL4 (GOWN DISPOSABLE) ×1 IMPLANT
GOWN STRL REUS W/TWL 2XL LVL3 (GOWN DISPOSABLE) ×3 IMPLANT
GOWN STRL REUS W/TWL LRG LVL3 (GOWN DISPOSABLE) ×2
GOWN STRL REUS W/TWL LRG LVL4 (GOWN DISPOSABLE) ×2
HANDPIECE SUCTION TUBG SURGILV (MISCELLANEOUS) ×3 IMPLANT
HEMOVAC 400CC 10FR (MISCELLANEOUS) ×3 IMPLANT
HOOD PEEL AWAY FACE SHEILD DIS (HOOD) ×6 IMPLANT
KIT RM TURNOVER STRD PROC AR (KITS) ×3 IMPLANT
NDL SAFETY 18GX1.5 (NEEDLE) ×3 IMPLANT
NS IRRIG 1000ML POUR BTL (IV SOLUTION) ×3 IMPLANT
OIL CARTRIDGE MAESTRO DRILL (MISCELLANEOUS) ×3
PACK HIP PROSTHESIS (MISCELLANEOUS) ×3 IMPLANT
SOL .9 NS 3000ML IRR  AL (IV SOLUTION) ×2
SOL .9 NS 3000ML IRR UROMATIC (IV SOLUTION) ×1 IMPLANT
SOL PREP PVP 2OZ (MISCELLANEOUS) ×3
SOLUTION PREP PVP 2OZ (MISCELLANEOUS) ×1 IMPLANT
SPONGE DRAIN TRACH 4X4 STRL 2S (GAUZE/BANDAGES/DRESSINGS) ×3 IMPLANT
STAPLER SKIN PROX 35W (STAPLE) ×3 IMPLANT
SUT ETHIBOND #5 BRAIDED 30INL (SUTURE) ×3 IMPLANT
SUT VIC AB 0 CT1 36 (SUTURE) ×3 IMPLANT
SUT VIC AB 1 CT1 36 (SUTURE) ×6 IMPLANT
SUT VIC AB 2-0 CT1 27 (SUTURE) ×2
SUT VIC AB 2-0 CT1 TAPERPNT 27 (SUTURE) ×1 IMPLANT
SYR 20CC LL (SYRINGE) ×3 IMPLANT
TAPE ADH 3 LX (MISCELLANEOUS) ×3 IMPLANT
TAPE TRANSPORE STRL 2 31045 (GAUZE/BANDAGES/DRESSINGS) ×3 IMPLANT
WATER STERILE IRR 1000ML POUR (IV SOLUTION) ×6 IMPLANT

## 2014-10-31 NOTE — Progress Notes (Signed)
More relaxed after fentanyl

## 2014-10-31 NOTE — Anesthesia Preprocedure Evaluation (Signed)
Anesthesia Evaluation  Patient identified by MRN, date of birth, ID band Patient awake    Reviewed: Allergy & Precautions, NPO status , Patient's Chart, lab work & pertinent test results  History of Anesthesia Complications (+) PONV  Airway Mallampati: II  TM Distance: >3 FB Neck ROM: Full    Dental  (+) Teeth Intact   Pulmonary asthma (last used inhaler 1 yr ago) , former smoker (quit x 1 yr),           Cardiovascular negative cardio ROS       Neuro/Psych    GI/Hepatic negative GI ROS, Neg liver ROS, GERD (no meds)  ,  Endo/Other  negative endocrine ROS  Renal/GU negative Renal ROS     Musculoskeletal  (+) Arthritis , Osteoarthritis,    Abdominal   Peds  Hematology negative hematology ROS (+)   Anesthesia Other Findings   Reproductive/Obstetrics                             Anesthesia Physical Anesthesia Plan  ASA: II  Anesthesia Plan: Spinal   Post-op Pain Management:    Induction: Intravenous  Airway Management Planned: Nasal Cannula  Additional Equipment:   Intra-op Plan:   Post-operative Plan:   Informed Consent: I have reviewed the patients History and Physical, chart, labs and discussed the procedure including the risks, benefits and alternatives for the proposed anesthesia with the patient or authorized representative who has indicated his/her understanding and acceptance.     Plan Discussed with:   Anesthesia Plan Comments:         Anesthesia Quick Evaluation

## 2014-10-31 NOTE — Anesthesia Procedure Notes (Signed)
Spinal Patient location during procedure: OR Start time: 10/31/2014 7:50 AM End time: 10/31/2014 7:20 AM Staffing Resident/CRNA: NOLES, MARK Preanesthetic Checklist Completed: patient identified, site marked, surgical consent, pre-op evaluation, timeout performed, IV checked, risks and benefits discussed and monitors and equipment checked Spinal Block Patient position: sitting Prep: Betadine Patient monitoring: heart rate, continuous pulse ox, blood pressure and cardiac monitor Approach: midline Location: L3-4 Injection technique: single-shot Needle Needle type: Whitacre and Introducer  Needle gauge: 24 G Needle length: 9 cm Needle insertion depth: 0 cm Assessment Sensory level: T10 Additional Notes Negative paresthesia. Negative blood return. Positive free-flowing CSF. Expiration date of kit checked and confirmed. Patient tolerated procedure well, without complications.     

## 2014-10-31 NOTE — Brief Op Note (Signed)
10/31/2014  10:32 AM  PATIENT:  Colton [name]  39 y.o. male  PRE-OPERATIVE DIAGNOSIS:  DJD right hip  POST-OPERATIVE DIAGNOSIS:  Same  PROCEDURE:  Procedure(s): TOTAL HIP ARTHROPLASTY (Right)  SURGEON:  Surgeon(s) and Role:    * Donato Heinz, MD - Primary  ASSISTANTS: Van Clines, PA    ANESTHESIA:   spinal  EBL:  Total I/O In: 2000 [I.V.:2000] Out: 250 [Urine:100; Blood:150]  BLOOD ADMINISTERED:none  DRAINS: 2 medium hemovac   LOCAL MEDICATIONS USED:  NONE  SPECIMEN:  Source of Specimen:  right femoral head  DISPOSITION OF SPECIMEN:  PATHOLOGY  COUNTS:  YES  TOURNIQUET:  * No tourniquets in log *  DICTATION: .Dragon Dictation  PLAN OF CARE: Admit to inpatient   PATIENT DISPOSITION:  PACU - hemodynamically stable.   Delay start of Pharmacological VTE agent (>24hrs) due to surgical blood loss or risk of bleeding: yes

## 2014-10-31 NOTE — Evaluation (Signed)
Physical Therapy Evaluation Patient Details Name: Colton [name] MRN: [id number] DOB: [birth year] Today's Date: 10/31/2014   History of Present Illness  Pt was admitted to the hospital s/p R THA on 10/31/14. Pt also presents with hx of CP.   Clinical Impression  Pt presents with hx of CP, OA, chronic pain, and postoperative nausea and vomiting. Examination reveals that pt performs bed mobility at min assist, transfers at [facility], and ambulation of 15 ft at [facility]. He shows very good functional strength in surrounding extremities and trunk to assist himself with mobility. He is a very motivated and pleasant person to work with. Although pt is performing mobility optimally for day zero, he needs to be slowed down at times due to minor impulsivity. Pt will continue to benefit from skilled PT in order to address his pain, ROM, and gait deficits in order for him to return home safely. Pt additionally has very good support in his wife and mother from his prior need for assistance secondary to his CP.     Follow Up Recommendations Home health PT;Supervision - Intermittent    Equipment Recommendations  Rolling walker with 5" wheels    Recommendations for Other Services       Precautions / Restrictions Precautions Precautions: Posterior Hip;Fall Restrictions Weight Bearing Restrictions: Yes RLE Weight Bearing: Weight bearing as tolerated      Mobility  Bed Mobility Overal bed mobility: Needs Assistance Bed Mobility: Supine to Sit     Supine to sit: Min assist     General bed mobility comments: Pt needs assist mostly for magement of LEs and sequence of guiding them out in order to maintain hip precautions. Needs cues to get to EOB while maintaining hip precautions  Transfers Overall transfer level: Needs assistance Equipment used: Rolling walker (2 wheeled) Transfers: Sit to/from Stand Sit to Stand: Min guard         General transfer comment: Pt transfers with great functional strength  and uses his LLE well to help power him up into standing. Needs cues for hand placement and a cue for propping the RLE out before transfering in order to not lean over too far  Ambulation/Gait Ambulation/Gait assistance: Min guard Ambulation Distance (Feet): 15 Feet (F/B) Assistive device: Rolling walker (2 wheeled) Gait Pattern/deviations: Step-to pattern;Decreased step length - right;Decreased step length - left;Decreased stride length Gait velocity: decreased Gait velocity interpretation: Below normal speed for age/gender General Gait Details: Pt expresses wish to walk a bit rather than just go to the chair. He is able to ambulate fairly well with manageable pain and no buckling. Pt needs cues on how to sequence gait with RW.   Stairs            Wheelchair Mobility    Modified Rankin (Stroke Patients Only)       Balance Overall balance assessment: No apparent balance deficits (not formally assessed)                                           Pertinent Vitals/Pain Pain Assessment: 0-10 Pain Score: 2  Pain Location: R hip  Pain Intervention(s): Limited activity within patient's tolerance;Monitored during session;Premedicated before session;Ice applied    Home Living Family/patient expects to be discharged to:: Private residence Living Arrangements: Spouse/significant other Available Help at Discharge: Family;Available 24 hours/day (wife) Type of Home: House Home Access: Level entry     Home  Layout: One level Home Equipment: Crutches;Electric scooter (lofstrand crutches )      Prior Function Level of Independence: Independent with assistive device(s)         Comments: Pt was able to walk up to 200 ft at a time with lofstrand crutches before becoming fatigued and needing to break.      Hand Dominance        Extremity/Trunk Assessment   Upper Extremity Assessment: Overall WFL for tasks assessed           Lower Extremity Assessment:  Overall WFL for tasks assessed;RLE deficits/detail RLE Deficits / Details: Grossly 2+/5 MMT throughout       Communication   Communication: No difficulties  Cognition Arousal/Alertness: Awake/alert Behavior During Therapy: WFL for tasks assessed/performed Overall Cognitive Status: Within Functional Limits for tasks assessed                      General Comments      Exercises Other Exercises Other Exercises: Pt performed bilateral therex x 10 reps at min A for facilitation of movement. Exercises included: ankle pumps, quad sets, glute sets, SLR (limited range), and hip abd      Assessment/Plan    PT Assessment Patient needs continued PT services  PT Diagnosis Difficulty walking;Abnormality of gait;Acute pain   PT Problem List Decreased strength;Decreased balance;Decreased mobility;Decreased activity tolerance;Decreased range of motion;Decreased safety awareness;Pain  PT Treatment Interventions DME instruction;Gait training;Stair training;Functional mobility training;Therapeutic activities;Therapeutic exercise;Balance training;Neuromuscular re-education;Wheelchair mobility training;Patient/family education;Manual techniques;Modalities   PT Goals (Current goals can be found in the Care Plan section) Acute Rehab PT Goals Patient Stated Goal: to return home PT Goal Formulation: With patient Time For Goal Achievement: 11/14/14 Potential to Achieve Goals: Good    Frequency BID   Barriers to discharge        Co-evaluation               End of Session Equipment Utilized During Treatment: Gait belt Activity Tolerance: Patient tolerated treatment well Patient left: in chair;with call bell/phone within reach;with chair alarm set;with nursing/sitter in room;with family/visitor present;with SCD's reapplied (Tented with pillows for hip precautions ) Nurse Communication: Mobility status         Time: 4782-9562 PT Time Calculation (min) (ACUTE ONLY): 29  min   Charges:         PT G CodesBenna Dunks 08-Nov-2014, 4:48 PM  Benna Dunks, SPT. [phone]

## 2014-10-31 NOTE — Progress Notes (Signed)
Patient was able to ambulate with PT and sit in the chair.  Nursing assisted the pt back to the bed

## 2014-10-31 NOTE — Transfer of Care (Signed)
Immediate Anesthesia Transfer of Care Note  Patient: Colton [name]  Procedure(s) Performed: Procedure(s): TOTAL HIP ARTHROPLASTY (Right)  Patient Location: PACU  Anesthesia Type:Spinal  Level of Consciousness: sedated  Airway & Oxygen Therapy: Patient Spontanous Breathing and Patient connected to face mask oxygen  Post-op Assessment: Report given to RN and Post -op Vital signs reviewed and stable  Post vital signs: Reviewed and stable  Last Vitals:  Filed Vitals:   10/31/14 0617  BP: 130/87  Pulse: 92  Temp: 37.1 C  Resp: 14    Complications: No apparent anesthesia complications

## 2014-10-31 NOTE — Progress Notes (Signed)
Bottom blood pressure number 99 called dr Henrene Hawking   Not to go over 100 for next 20 minutes  If so to receive apresoline  If not to go to floor

## 2014-10-31 NOTE — Op Note (Signed)
OPERATIVE NOTE  DATE OF SURGERY:  10/31/2014  PATIENT NAME:  Colton [name]   DOB: [birth year]  MRN: [id number]  PRE-OPERATIVE DIAGNOSIS: Degenerative arthrosis of the right hip, primary (superimposed on hip dysplasia)  POST-OPERATIVE DIAGNOSIS:  Same  PROCEDURE:  Right total hip arthroplasty  SURGEON:  Jena Gauss. M.D.  ASSISTANT:  Van Clines, PA (present and scrubbed throughout the case, critical for assistance with exposure, retraction, instrumentation, and closure)  ANESTHESIA: spinal  ESTIMATED BLOOD LOSS: 150 mL  FLUIDS REPLACED: 2000 mL of crystalloid  DRAINS: 2 medium drains to a Hemovac reservoir  IMPLANTS UTILIZED: DePuy 12 mm small stature AML femoral stem, 52 mm OD Pinnacle Gription Sector acetabular component, 2 - 6.5 cancellous screws, neutral Pinnacle Marathon polyethylene insert, and a 36 mm M-SPEC -2 mm hip ball  INDICATIONS FOR SURGERY: Colton [name] is a 39 y.o. year old male with a long history of progressive hip and groin  pain. He has a history of cervical palsy and underwent previous right hip procedures for hip dysplasia. X-rays demonstrated severe degenerative changes with a slightly dysplastic acetabulum. The patient had not seen any significant improvement despite conservative nonsurgical intervention. After discussion of the risks and benefits of surgical intervention, the patient expressed understanding of the risks benefits and agree with plans for total hip arthroplasty.   The risks, benefits, and alternatives were discussed at length including but not limited to the risks of infection, bleeding, nerve injury, stiffness, blood clots, the need for revision surgery, limb length inequality, dislocation, cardiopulmonary complications, among others, and they were willing to proceed.  PROCEDURE IN DETAIL: The patient was brought into the operating room and, after adequate spinal anesthesia was achieved, the patient was placed in a left lateral decubitus  position. Axillary roll was placed and all bony prominences were well-padded. The patient's right hip was cleaned and prepped with alcohol and DuraPrep and draped in the usual sterile fashion. A "timeout" was performed as per usual protocol. A lateral curvilinear incision was made, partially including the previous surgical scar, and then gently curving towards the posterior superior iliac spine. The IT band was incised in line with the skin incision and the fibers of the gluteus maximus were split in line.  A T type posterior capsulotomy was performed. Prior to dislocation of the femoral head, a threaded Steinmann pin was inserted through a separate stab incision into the pelvis superior to the acetabulum and bent in the form of a stylus so as to assess limb length and hip offset throughout the procedure. The femoral head was then dislocated posteriorly. Inspection of the femoral head demonstrated severe degenerative changes with full-thickness loss of articular cartilage. The femoral neck cut was performed using an oscillating saw. The anterior capsule was elevated off of the femoral neck using a periosteal elevator. Attention was then directed to the acetabulum. The remnant of the labrum was excised using electrocautery. Inspection of the acetabulum also demonstrated significant degenerative changes. The acetabulum was reamed in sequential fashion up to a 51 mm diameter. Good punctate bleeding bone was encountered. A 52 mm Pinnacle Gription Sector acetabular component was positioned and impacted into place. Good scratch fit was appreciated. Two 6.5 mm cancellous screws were used for additional fixation. A neutral polyethylene trial was inserted.  Attention was then directed to the proximal femur. A hole for reaming of the proximal femoral canal was created using a high-speed burr. The femoral canal was reamed in sequential fashion up to a 12 mm diameter. Serial  broaches were inserted up to a 12 mm small stature  femoral broach.A trial reduction was performed using a 36 mm hip ball with a -2 mm neck length. Good equalization of limb lengths and hip offset was appreciated and excellent stability was noted both anteriorly and posteriorly. Trial components were removed. The acetabular shell was irrigated with copious amounts of normal saline with antibiotic solution and suctioned dry. A neutral Pinnacle Marathon polyethylene insert was positioned and impacted into place. Next, a 12 mm small stature AML femoral stem was positioned and impacted into place. Good scratch fit was appreciated. A trial reduction was again performed with a 36mm hip ball with a =2 mm neck length. Again, good equalization of limb lengths was appreciated and excellent stability appreciated both anteriorly and posteriorly. The hip was then dislocated and the trial hip ball was removed. The Morse taper was cleaned and dried. A 36 mm M-SPEC hip ball with a -2 mm neck length was placed on the trunnion and impacted into place. The hip was then reduced and placed through range of motion. Excellent stability was appreciated both anteriorly and posteriorly.  The wound was irrigated with copious amounts of normal saline with antibiotic solution and suctioned dry. Good hemostasis was appreciated. The posterior capsulotomy was repaired using #5 Ethibond. Piriformis tendon was reapproximated to the undersurface of the gluteus medius tendon using #5 Ethibond. Two medium drains were placed in the wound bed and brought out through separate stab incisions to be attached to a Hemovac reservoir. The IT band was reapproximated using interrupted sutures of #1 Vicryl. Subcutaneous tissue was proximal phalanx using first #0 Vicryl followed by #2-0 Vicryl. The skin was closed with skin staples.  The patient tolerated the procedure well and was transported to the recovery room in stable condition.   Jena Gauss., M.D.

## 2014-10-31 NOTE — Care Management Note (Signed)
Case Management Note  Patient Details  Name: Colton [name] MRN: [id number] Date of Birth: 10/24/1975  Subjective/Objective:     39yo Mr Colton [name] was admitted 10/31/14 and received [name] surgical right total hip by Dr Colton [name] the same day. Hx of Cerebral Palsy, chronic pain issues, arthritis. Resides with his wife who will provide transportation to appointments. PCP=Dr Colton [name], Dr Colton [name] at [facility].  Pharmacy=[facility] [address]. Home equipment includes [name] shower chair and [name] motorized scooter. Mr Colton [name] has had lifelong musculoskeletal issues, and cannot ambulate far without pain or tiring. Mr Colton [name] was provided with [name] list of home health providers from which to chose. Family is still deciding on [name] provider. Case management will follow for discharge planning.             Action/Plan:   Expected Discharge Date:                  Expected Discharge Plan:     In-House Referral:     Discharge planning Services     Post Acute Care Choice:    Choice offered to:     DME Arranged:    DME Agency:     HH Arranged:    HH Agency:     Status of Service:     Medicare Important Message Given:    Date Medicare IM Given:    Medicare IM give by:    Date Additional Medicare IM Given:    Additional Medicare Important Message give by:     If discussed at Long Length of Stay Meetings, dates discussed:    Additional Comments:  Colton Sheriff A, RN 10/31/2014, 2:33 PM

## 2014-10-31 NOTE — H&P (Signed)
The patient has been re-examined, and the chart reviewed, and there have been no interval changes to the documented history and physical.    The risks, benefits, and alternatives have been discussed at length. The patient expressed understanding of the risks benefits and agreed with plans for surgical intervention.  Maxximus Gotay P. Leyana Whidden, Jr. M.D.    

## 2014-11-01 LAB — BASIC METABOLIC PANEL
ANION GAP: 7 (ref 5–15)
BUN: 8 mg/dL (ref 6–20)
CALCIUM: 8.4 mg/dL — AB (ref 8.9–10.3)
CO2: 24 mmol/L (ref 22–32)
Chloride: 107 mmol/L (ref 101–111)
Creatinine, Ser: 0.92 mg/dL (ref 0.61–1.24)
GFR calc Af Amer: >60 mL/min (ref >60)
Glucose, Bld: 107 mg/dL — ABNORMAL HIGH (ref 65–99)
POTASSIUM: 3.9 mmol/L (ref 3.5–5.1)
SODIUM: 138 mmol/L (ref 135–145)

## 2014-11-01 LAB — CBC
HCT: 35.6 % — ABNORMAL LOW (ref 40.0–52.0)
Hemoglobin: 12.1 g/dL — ABNORMAL LOW (ref 13.0–18.0)
MCH: 29.8 pg (ref 26.0–34.0)
MCHC: 33.9 g/dL (ref 32.0–36.0)
MCV: 87.7 fL (ref 80.0–100.0)
PLATELETS: 186 10*3/uL (ref 150–440)
RBC: 4.06 MIL/uL — AB (ref 4.40–5.90)
RDW: 12.8 % (ref 11.5–14.5)
WBC: 11.7 10*3/uL — AB (ref 3.8–10.6)

## 2014-11-01 NOTE — Progress Notes (Signed)
Plan of care discussed with patient. Plan of care placed on board. Pain controlled with PRN medication. Patient states he would like to be awakening for pain medication. IV fluids infusing. Dressing CDI. Hemovac in place. Call bell within reach.

## 2014-11-01 NOTE — Evaluation (Signed)
Occupational Therapy Evaluation Patient Details Name: Jadarian Mckay MRN: [id number] DOB: 1975-11-03 Today's Date: 11/01/2014    History of Present Illness Pt was admitted to the hospital s/p R THA on 10/31/14. Pt also presents with hx of CP.    Clinical Impression   This patient is a 39 year old male who came to [facility] for a R  total hip replacement (posterior approach) .  Patient lives in a  home with his wife.  He had been modified independent with ADL and functional mobility using forearm crutches. He now requires assistance and would benefit from Occupational Therapy for ADL/functional mobility training while .staying within hip precautions (posterior approach)  .      Follow Up Recommendations       Equipment Recommendations       Recommendations for Other Services       Precautions / Restrictions Precautions Precautions: Posterior Hip;Fall Restrictions Weight Bearing Restrictions: Yes RLE Weight Bearing: Weight bearing as tolerated      Mobility Bed Mobility                  Transfers                      Balance                                            ADL                                         General ADL Comments: Patient had been independent with ADL using loftstrand crutches. Patient practiced today techniques for lower body dressing using hip kit with hand over hand assist to illustrate these techniques. Used physical and verbal cues for proper technique.     Vision     Perception     Praxis      Pertinent Vitals/Pain Pain Score: 3  Pain Location: R hip     Hand Dominance     Extremity/Trunk Assessment Upper Extremity Assessment Upper Extremity Assessment: Overall WFL for tasks assessed   Lower Extremity Assessment Lower Extremity Assessment: Defer to PT evaluation       Communication Communication Communication: No difficulties   Cognition  Arousal/Alertness: Awake/alert Behavior During Therapy: WFL for tasks assessed/performed Overall Cognitive Status: Within Functional Limits for tasks assessed                     General Comments       Exercises       Shoulder Instructions      Home Living Family/patient expects to be discharged to:: Private residence Living Arrangements: Spouse/significant other Available Help at Discharge: Family;Available 24 hours/day Type of Home: House Home Access: Level entry     Home Layout: One level               Home Equipment: Crutches;Electric scooter          Prior Functioning/Environment Level of Independence: Independent with assistive device(s) with basic activities of daily living             OT Diagnosis: Acute pain   OT Problem List: Decreased range of motion;Decreased activity tolerance;Impaired balance (sitting and/or standing);Impaired tone;Pain   OT Treatment/Interventions:  OT Goals(Current goals can be found in the care plan section) Acute Rehab OT Goals Patient Stated Goal: to return home OT Goal Formulation: With patient/family Time For Goal Achievement: 11/15/14 Potential to Achieve Goals: Good  OT Frequency: Min 1X/week   Barriers to D/C:            Co-evaluation              End of Session Equipment Utilized During Treatment:  (hip kit)  Activity Tolerance:   Patient left: in bed;with call bell/phone within reach;with bed alarm set   Time: 4103-0131 OT Time Calculation (min): 31 min Charges:  OT General Charges $OT Visit: 1 Procedure OT Evaluation $Initial OT Evaluation Tier I: 1 Procedure OT Treatments $Self Care/Home Management : 8-22 mins G-Codes:    Myrene Galas, MS/OTR/L  11/01/2014, 11:52 AM

## 2014-11-01 NOTE — Progress Notes (Signed)
   Subjective: 1 Day Post-Op Procedure(s) (LRB): TOTAL HIP ARTHROPLASTY (Right) Patient reports pain as 4 on 0-10 scale.   Patient is well, and has had no acute complaints or problems Continue with physical  therapy today.  Plan is to go Home after hospital stay. no nausea and no vomiting Patient denies any chest pains or shortness of breath. Objective: Vital signs in last 24 hours: Temp:  [96.1 F (35.6 C)-98.4 F (36.9 C)] 97.8 F (36.6 C) (10/04 0619) Pulse Rate:  [68-100] 86 (10/04 0619) Resp:  [12-20] 20 (10/04 0619) BP: (103-136)/(57-94) 114/71 mmHg (10/04 0619) SpO2:  [96 %-100 %] 98 % (10/04 0619) FiO2 (%):  [21 %] 21 % (10/03 1212) Minimal swelling to right thigh  Dsg clear and dry. No signs of infection. Heels are non tender and elevated off the bed using rolled towels Intake/Output from previous day: 10/03 0701 - 10/04 0700 In: 4786.3 [P.O.:480; I.V.:3646.3; IV Piggyback:660] Out: 5575 [Urine:5275; Drains:150; Blood:150] Intake/Output this shift:     Recent Labs  11/01/14 0531  HGB 12.1*    Recent Labs  11/01/14 0531  WBC 11.7*  RBC 4.06*  HCT 35.6*  PLT 186    Recent Labs  11/01/14 0531  NA 138  K 3.9  CL 107  CO2 24  BUN 8  CREATININE 0.92  GLUCOSE 107*  CALCIUM 8.4*   No results for input(s): LABPT, INR in the last 72 hours.  EXAM General - Patient is Alert, Appropriate and Oriented Extremity - Neurologically intact Neurovascular intact Sensation intact distally Intact pulses distally Dressing - dressing C/D/I Motor Function - intact, moving foot and toes well on exam.   Past Medical History  Diagnosis Date  . Cerebral palsy (HCC)   . Osteoarthritis   . Chronic pain   . PONV (postoperative nausea and vomiting)     Assessment/Plan: 1 Day Post-Op Procedure(s) (LRB): TOTAL HIP ARTHROPLASTY (Right) Active Problems:   S/P total hip arthroplasty  Estimated body mass index is 25.84 kg/(m^2) as calculated from the following:  Height as of this encounter:  (1.676 m).   Weight as of this encounter: 72.576 kg (160 lb). Advance diet Up with therapy D/C IV fluids Discharge home with home health  Labs: were reviewed DVT Prophylaxis - Lovenox, Foot Pumps and TED hose Weight-Bearing as tolerated to right leg D/C O2 and Pulse OX and try on Room Air   Zahari Xiang R. [facility] 11/01/2014, 7:37 AM

## 2014-11-01 NOTE — Anesthesia Postprocedure Evaluation (Addendum)
  Anesthesia Post-op Note  Patient: Colton [name]  Procedure(s) Performed: Procedure(s): TOTAL HIP ARTHROPLASTY (Right)  Anesthesia type:Spinal  Patient location: 141  Post pain: Pain level controlled  Post assessment: Post-op Vital signs reviewed, Patient's Cardiovascular Status Stable, Respiratory Function Stable, Patent Airway and No signs of Nausea or vomiting  Post vital signs: Reviewed and stable  Last Vitals:  Filed Vitals:   11/01/14 0619  BP: 114/71  Pulse: 86  Temp: 36.6 C  Resp: 20    Level of consciousness: awake, alert  and patient cooperative  Complications: No apparent anesthesia complications

## 2014-11-01 NOTE — Progress Notes (Signed)
Physical Therapy Treatment Patient Details Name: Colton [name] MRN: [id number] DOB: [birth year] Today's Date: 11/01/2014    History of Present Illness Pt was admitted to the hospital s/p R THA on 10/31/14. Pt also presents with hx of CP.     PT Comments    Pt tolerated treatment well this afternoon. Pt displayed increased independence with bed mobility this afternoon, performing bed mobility with mod indep for supine<>sit transfer. Pt made PT aware at earlier session that he feels more comfortable using lofstrand crutches for ambulation vs rolling walker. Pt was able to transfer sit<>stand and ambulate with loftstrand crutches and min assist +1. Pt appeared stable on loftstrands and no instances of imabalance/unsteadiness noted. Pt continues to make good progress toward his goal of "walking further' as he was able to ambulate 125 ft this afternoon. Gait abnormalities noted on RLE; decreased terminal knee extension and lack of ability to land with a flat foot; both a which pt states are baseline deviations for him due to his spasticity. Pt left on [facility] with mother present and CNA made aware that he would call her when he was finished. Pt will continue to benefit from skilled acute PT services in order to continue to increase strength and functional mobility/ independence.    Follow Up Recommendations  Home health PT;Supervision - Intermittent     Equipment Recommendations  Rolling walker with 5" wheels    Recommendations for Other Services       Precautions / Restrictions Precautions Precautions: Posterior Hip;Fall Restrictions Weight Bearing Restrictions: Yes RLE Weight Bearing: Weight bearing as tolerated    Mobility  Bed Mobility Overal bed mobility: Modified Independent Bed Mobility: Supine to Sit     Supine to sit: Modified independent (Device/Increase time)     General bed mobility comments: Pt utilized trapeze and bed rail for transition from supine<>siting on EOB.  Significant amount of time required for transfer. Pt did well with self management of hip precautions.  Transfers Overall transfer level: Needs assistance Equipment used: Lofstrands Transfers: Sit to/from Stand Sit to Stand: Min guard         General transfer comment: Pt transfers with great functional strength and uses his LLE well to help power him up into standing. Pt states the feels more comfortable using lofstrand crutches for transfers.  Ambulation/Gait Ambulation/Gait assistance: Min assist Ambulation Distance (Feet): 125 Feet Assistive device: Lofstrands Gait Pattern/deviations: Step-through pattern;Decreased step length - left;Decreased step length - right;Decreased stance time - right;Antalgic Gait velocity: decreased Gait velocity interpretation: Below normal speed for age/gender General Gait Details: Pt able to demonstrate increased gait speed toward the end of the ~125 feet. Takes longer step with the L than with the R due to spasticity in the R leg.    Stairs            Wheelchair Mobility    Modified Rankin (Stroke Patients Only)       Balance Overall balance assessment: No apparent balance deficits (not formally assessed) (pt appeared steady using the loftstrand crutches)                                  Cognition Arousal/Alertness: Awake/alert Behavior During Therapy: WFL for tasks assessed/performed Overall Cognitive Status: Within Functional Limits for tasks assessed                      Exercises Total Joint Exercises Ankle Circles/Pumps: AROM;Both;20 reps;Supine  Quad Sets: Strengthening;Right;10 reps;Supine Gluteal Sets: Strengthening;15 reps;Both;Supine Towel Squeeze: Strengthening;10 reps;Supine;Both Straight Leg Raises: Strengthening;Both;10 reps;Supine Other Exercises Other Exercises: Pt performed bilateral therex x 12 reps at min A for facilitation of movement. Exercises included: ankle pumps, quad sets, glute  sets, SLR (limited range), and hip abd    General Comments        Pertinent Vitals/Pain Pain Assessment: 0-10 Pain Score: 2  Pain Location: R hip Pain Intervention(s): Limited activity within patient's tolerance;Monitored during session    Home Living Family/patient expects to be discharged to:: Private residence Living Arrangements: Spouse/significant other Available Help at Discharge: Family;Available 24 hours/day Type of Home: House Home Access: Level entry   Home Layout: One level Home Equipment: Crutches;Electric scooter      Prior Function Level of Independence: Independent with assistive device(s)          PT Goals (current goals can now be found in the care plan section) Acute Rehab PT Goals Patient Stated Goal: to walk further PT Goal Formulation: With patient Time For Goal Achievement: 11/14/14 Potential to Achieve Goals: Good Progress towards PT goals: Progressing toward goals    Frequency  BID    PT Plan Current plan remains appropriate    Co-evaluation             End of Session Equipment Utilized During Treatment: Gait belt Activity Tolerance: Patient tolerated treatment well Patient left:  (Pt left on [facility] with mother present and CNA on duty notified. Pt instructed to call his nurse when he was done on the  [facility]. Pt verbalized understanding. )     Time: 1610-9604 PT Time Calculation (min) (ACUTE ONLY): 28 min  Charges:                       G Codes:      Ashvin Adelson,SPT 11/01/2014, 3:05 PM

## 2014-11-01 NOTE — Progress Notes (Signed)
Physical Therapy Treatment Patient Details Name: Colton [name] MRN: [id number] DOB: [birth year] Today's Date: 11/01/2014    History of Present Illness Pt was admitted to the hospital s/p R THA on 10/31/14. Pt also presents with hx of CP.     PT Comments    Pt performed well with therapy this morning with greater ambulation distance and improved therex tolerance. Pt reporting minimal pain of 2/10 throughout session. His greatest deficit at this point is his gait, which has been a challenge beforehand secondary to his CP. PT will trial lofstrand crutches this afternoon for a more natural gait pattern that he is accustomed to given prior level. He will continue to benefit from skilled PT in order to return home safely with family. Pt remains very pleasant and willing to participate in therapy.  Follow Up Recommendations  Home health PT;Supervision - Intermittent     Equipment Recommendations  Rolling walker with 5" wheels    Recommendations for Other Services       Precautions / Restrictions Precautions Precautions: Posterior Hip;Fall Restrictions Weight Bearing Restrictions: Yes RLE Weight Bearing: Weight bearing as tolerated    Mobility  Bed Mobility Overal bed mobility: Needs Assistance Bed Mobility: Supine to Sit     Supine to sit: Min assist     General bed mobility comments: Pt needs assist mostly for magement of LEs and sequence of guiding them out in order to maintain hip precautions. Needs cues to get to EOB while maintaining hip precautions  Transfers Overall transfer level: Needs assistance Equipment used: Rolling walker (2 wheeled) Transfers: Sit to/from Stand Sit to Stand: Min guard         General transfer comment: Pt transfers with great functional strength and uses his LLE well to help power him up into standing. Needs cues for hand placement and a cue for propping the RLE out before transfering in order to not lean over too  far  Ambulation/Gait Ambulation/Gait assistance: Min guard Ambulation Distance (Feet): 40 Feet Assistive device: Rolling walker (2 wheeled) Gait Pattern/deviations: Step-through pattern;Decreased step length - right;Decreased step length - left;Antalgic (spasticity RLE preventing flat foot) Gait velocity: decreased Gait velocity interpretation: Below normal speed for age/gender General Gait Details: Pt ambulating with greater gait speed this morning. Still requires cues for sequencing. Plan is to attempt lofstrand crutches this afternoon    Stairs            Wheelchair Mobility    Modified Rankin (Stroke Patients Only)       Balance Overall balance assessment: No apparent balance deficits (not formally assessed)                                  Cognition Arousal/Alertness: Awake/alert Behavior During Therapy: WFL for tasks assessed/performed Overall Cognitive Status: Within Functional Limits for tasks assessed                      Exercises Other Exercises Other Exercises: Pt performed bilateral therex x 12 reps at min A for facilitation of movement. Exercises included: ankle pumps, quad sets, glute sets, SLR (limited range), and hip abd    General Comments        Pertinent Vitals/Pain Pain Assessment: 0-10 Pain Score: 2  Pain Location: R hip Pain Intervention(s): Limited activity within patient's tolerance;Monitored during session;Premedicated before session    Home Living Family/patient expects to be discharged to:: Private residence Living Arrangements: Spouse/significant  other Available Help at Discharge: Family;Available 24 hours/day Type of Home: House Home Access: Level entry   Home Layout: One level Home Equipment: Crutches;Electric scooter      Prior Function Level of Independence: Independent with assistive device(s)          PT Goals (current goals can now be found in the care plan section) Acute Rehab PT  Goals Patient Stated Goal: to walk further PT Goal Formulation: With patient Time For Goal Achievement: 11/14/14 Potential to Achieve Goals: Good Progress towards PT goals: Progressing toward goals    Frequency  BID    PT Plan Current plan remains appropriate    Co-evaluation             End of Session Equipment Utilized During Treatment: Gait belt Activity Tolerance: Patient tolerated treatment well Patient left: in chair;with call bell/phone within reach;with chair alarm set;with nursing/sitter in room;with family/visitor present;with SCD's reapplied     Time: 0865-7846 PT Time Calculation (min) (ACUTE ONLY): 29 min  Charges:                       G CodesBenna Dunks 2014-11-09, 1:22 PM  Benna Dunks, SPT. [phone]

## 2014-11-01 NOTE — Care Management Note (Addendum)
Case Management Note  Patient Details  Name: Colton [name] MRN: [id number] Date of Birth: Jun 30, 1975  Subjective/Objective:                  Met to follow up with patient regarding home health list provided from previous RNCM. He would like to use [facility] for home health. He plans to return home with his wife. He states he would like to have new aluminum crutches with arm braces instead of a rolling walker although he does not have a rolling walker available at home. He uses [facility] ([facility] [employer]) [phone] for Rx.  Action/Plan: Lovenox 71m #14 called in to [facility]. [facility] Updated PT about patient DME request. RX will be needed to crutches with arm braces- "forearm crutches".  RNCM will continue to follow.   Expected Discharge Date:                  Expected Discharge Plan:     In-House Referral:     Discharge planning Services  CM Consult  Post Acute Care Choice:  Durable Medical Equipment, Home Health Choice offered to:  Patient  DME Arranged:  Walker rolling (crutches with arm braces) DME Agency:  [place] Status of Service:  In process, will continue to follow  Medicare Important Message Given:    Date Medicare IM Given:    Medicare IM give by:    Date Additional Medicare IM Given:    Additional Medicare Important Message give by:     If discussed at [place] Stay Meetings, dates discussed:    Additional Comments: Forearm cr[place] from [place] recommended by PT. Rx needed. Lovenox $25.43. Patient requesting bedside commode also. Referral also called to [facility] with [facility].    AMarshell Garfinkel RN 11/01/2014, 9:58 AM

## 2014-11-01 NOTE — Progress Notes (Signed)
Clinical Social Worker (CSW) received SNF consult. PT is recommending home health. RN Case Manager aware of above. Please reconsult if future social work needs arise. CSW signing off.   Song Myre Morgan, LCSWA [phone] 

## 2014-11-02 LAB — BASIC METABOLIC PANEL
Anion gap: 7 (ref 5–15)
BUN: 6 mg/dL (ref 6–20)
CALCIUM: 8.5 mg/dL — AB (ref 8.9–10.3)
CO2: 24 mmol/L (ref 22–32)
CREATININE: 0.9 mg/dL (ref 0.61–1.24)
Chloride: 106 mmol/L (ref 101–111)
Glucose, Bld: 97 mg/dL (ref 65–99)
Potassium: 3.6 mmol/L (ref 3.5–5.1)
SODIUM: 137 mmol/L (ref 135–145)

## 2014-11-02 LAB — CBC
HCT: 34.9 % — ABNORMAL LOW (ref 40.0–52.0)
HEMOGLOBIN: 12 g/dL — AB (ref 13.0–18.0)
MCH: 30.2 pg (ref 26.0–34.0)
MCHC: 34.4 g/dL (ref 32.0–36.0)
MCV: 87.8 fL (ref 80.0–100.0)
Platelets: 166 10*3/uL (ref 150–440)
RBC: 3.97 MIL/uL — ABNORMAL LOW (ref 4.40–5.90)
RDW: 12.8 % (ref 11.5–14.5)
WBC: 8.8 10*3/uL (ref 3.8–10.6)

## 2014-11-02 LAB — SURGICAL PATHOLOGY

## 2014-11-02 MED ORDER — LACTULOSE 10 GM/15ML PO SOLN
20.0000 g | Freq: Two times a day (BID) | ORAL | Status: DC | PRN
  Administered 2014-11-02: 20 g via ORAL
  Filled 2014-11-02: qty 30

## 2014-11-02 MED ORDER — ENOXAPARIN SODIUM 40 MG/0.4ML ~~LOC~~ SOLN: 40.0000 mg | SUBCUTANEOUS | Status: AC

## 2014-11-02 MED ORDER — OXYCODONE HCL 5 MG PO TABS: 5.0000 mg | ORAL_TABLET | ORAL | Status: AC | PRN

## 2014-11-02 MED ORDER — TRAMADOL HCL 50 MG PO TABS: 50.0000 mg | ORAL_TABLET | ORAL | Status: AC | PRN

## 2014-11-02 NOTE — Progress Notes (Signed)
s/p post right total hip: post-op day 2, doing well with PT- had two therapy session today, assisted to bathroom as needed. Tolerated regular diet well. Discharge in am to home with home health.

## 2014-11-02 NOTE — Progress Notes (Signed)
Physical Therapy Treatment Patient Details Name: Colton [name] MRN: [id number] DOB: 1976-01-11 Today's Date: 11/02/2014    History of Present Illness Pt was admitted to the hospital s/p R THA on 10/31/14. Pt also presents with hx of CP.     PT Comments    Pt again progressing with mobility with ambulation of 160 ft this morning. Pt is performing transfers with greater independence. Although his awareness of his precautions is sound, he still needs work on performing bed mobility without use of trapeze (which he will not have at home). He continues to be very pleasant and motivated to participate in therapy, as well as proactive about seeking education from therapist. Due to his strength, ROM, and gait deficits, he will continue to benefit from skilled PT in order to return home safely.   Follow Up Recommendations  Home health PT;Supervision - Intermittent     Equipment Recommendations   (lofstrand crutches )    Recommendations for Other Services       Precautions / Restrictions Precautions Precautions: Posterior Hip;Fall Restrictions Weight Bearing Restrictions: Yes RLE Weight Bearing: Weight bearing as tolerated    Mobility  Bed Mobility Overal bed mobility: Needs Assistance Bed Mobility: Supine to Sit     Supine to sit: Min assist     General bed mobility comments: Pt min assist without use of trapeze. Needs slight trunk support getting into sitting, otherwise good use of handrails and awareness of hip precautions  Transfers Overall transfer level: Needs assistance Equipment used: Lofstrands Transfers: Sit to/from Stand Sit to Stand: Min guard         General transfer comment: Pt with one hand support on lofstrand and one hand on bed. Good strength getting into standing using this method. No LOB.   Ambulation/Gait Ambulation/Gait assistance: Min guard Ambulation Distance (Feet): 160 Feet Assistive device: Lofstrands Gait Pattern/deviations: Step-through  pattern;Decreased step length - right;Decreased step length - left;Decreased stride length;Decreased dorsiflexion - right Gait velocity: 0.6 ft/s Gait velocity interpretation: <1.8 ft/sec, indicative of risk for recurrent falls General Gait Details: Gait starts of very slow and labored but after about 10 ft of ambulation his gait speed picks up. He needs cues for appropriate step length given RLE spasticity    Stairs            Wheelchair Mobility    Modified Rankin (Stroke Patients Only)       Balance Overall balance assessment: No apparent balance deficits (not formally assessed)                                  Cognition Arousal/Alertness: Awake/alert Behavior During Therapy: WFL for tasks assessed/performed Overall Cognitive Status: Within Functional Limits for tasks assessed                      Exercises Other Exercises Other Exercises: Pt performed bilateral therex x 15 reps at supervision for proper technique. Exercises included: ankle pumps, quad sets, glute sets, SLR (limited range), and hip abd    General Comments        Pertinent Vitals/Pain Pain Assessment: 0-10 Pain Score: 2  Pain Location: R hip  Pain Intervention(s): Limited activity within patient's tolerance;Monitored during session;Premedicated before session    Home Living                      Prior Function  PT Goals (current goals can now be found in the care plan section) Acute Rehab PT Goals Patient Stated Goal: to walk again  PT Goal Formulation: With patient Time For Goal Achievement: 11/14/14 Potential to Achieve Goals: Good Progress towards PT goals: Progressing toward goals    Frequency  BID    PT Plan Current plan remains appropriate;Equipment recommendations need to be updated    Co-evaluation             End of Session Equipment Utilized During Treatment: Gait belt Activity Tolerance: Patient tolerated treatment  well Patient left: in chair;with call bell/phone within reach;with chair alarm set;with SCD's reapplied     Time: 7846-9629 PT Time Calculation (min) (ACUTE ONLY): 24 min  Charges:                       G CodesBenna Dunks 2014-11-13, 10:41 AM  Benna Dunks, SPT. [phone]

## 2014-11-02 NOTE — Progress Notes (Signed)
   Subjective: 2 Days Post-Op Procedure(s) (LRB): TOTAL HIP ARTHROPLASTY (Right) Patient reports pain as 2 on 0-10 scale.   Patient is well, and has had no acute complaints or problems Will continue with physical therapy today.  Plan is to go Home after hospital stay. no nausea and no vomiting Patient denies any chest pains or shortness of breath. Objective: Vital signs in last 24 hours: Temp:  [98.4 F (36.9 C)-98.9 F (37.2 C)] 98.9 F (37.2 C) (10/05 0501) Pulse Rate:  [92-97] 97 (10/05 0501) Resp:  [16-18] 18 (10/05 0501) BP: (105-132)/(57-80) 111/68 mmHg (10/05 0501) SpO2:  [95 %-98 %] 95 % (10/05 0501) well approximated incision Heels are non tender and elevated off the bed using rolled towels Intake/Output from previous day: 10/04 0701 - 10/05 0700 In: 1508.3 [P.O.:240; I.V.:1268.3] Out: 2115 [Urine:1895; Drains:220] Intake/Output this shift: Total I/O In: -  Out: 905 [Urine:825; Drains:80]   Recent Labs  11/01/14 0531 11/02/14 0534  HGB 12.1* 12.0*    Recent Labs  11/01/14 0531 11/02/14 0534  WBC 11.7* 8.8  RBC 4.06* 3.97*  HCT 35.6* 34.9*  PLT 186 166    Recent Labs  11/01/14 0531 11/02/14 0534  NA 138 137  K 3.9 3.6  CL 107 106  CO2 24 24  BUN 8 6  CREATININE 0.92 0.90  GLUCOSE 107* 97  CALCIUM 8.4* 8.5*   No results for input(s): LABPT, INR in the last 72 hours.  EXAM General - Patient is Alert, Appropriate and Oriented Extremity - Neurologically intact Neurovascular intact Sensation intact distally Intact pulses distally Dressing - dressing C/D/I Motor Function - intact, moving foot and toes well on exam.    Past Medical History  Diagnosis Date  . Cerebral palsy (HCC)   . Osteoarthritis   . Chronic pain   . PONV (postoperative nausea and vomiting)     Assessment/Plan: 2 Days Post-Op Procedure(s) (LRB): TOTAL HIP ARTHROPLASTY (Right) Active Problems:   S/P total hip arthroplasty  Estimated body mass index is 25.84  kg/(m^2) as calculated from the following:   Height as of this encounter:  (1.676 m).   Weight as of this encounter: 72.576 kg (160 lb). Up with therapy Plan for discharge tomorrow Discharge home with home health  Labs: were reviewed DVT Prophylaxis - Lovenox, Foot Pumps and TED hose Weight-Bearing as tolerated to right leg Needs a bowel movement today hemovac d/c'd today  Colton [name] R. [facility] PA [facility] 11/02/2014, 6:41 AM

## 2014-11-02 NOTE — Progress Notes (Signed)
Physical Therapy Treatment Patient Details Name: Colton [name] MRN: [id number] DOB: [birth year] Today's Date: 11/02/2014    History of Present Illness Pt was admitted to the hospital s/p R THA on 10/31/14. Pt also presents with hx of CP.     PT Comments    Pt continuing to demonstrate progression with bed mobility, transfers and ambulation needing less assist and improving quality overall. Pain well managed. Pt has good safety awareness and adheres to posterior hip precautions. Post session pt required time in the bathroom with both nursing assistant and nurse made aware. Pt agrees to use call bell when finished and nursing will check on patient. Mother present in room as well. Continue PT for progression of strength and endurance to allow for optimal functional mobility for safe return home.   Follow Up Recommendations  Home health PT;Supervision - Intermittent     Equipment Recommendations  Rolling walker with 5" wheels    Recommendations for Other Services       Precautions / Restrictions Precautions Precautions: Posterior Hip;Fall Restrictions Weight Bearing Restrictions: Yes RLE Weight Bearing: Weight bearing as tolerated    Mobility  Bed Mobility Overal bed mobility: Modified Independent (Increased time) Bed Mobility: Supine to Sit     Supine to sit: Modified independent (Device/Increase time)     General bed mobility comments: Pt requires increased time, but performs without assist and minimal use of trapeze  Transfers Overall transfer level: Modified independent Equipment used: Rolling walker (2 wheeled) Transfers: Sit to/from Stand Sit to Stand: Supervision            Ambulation/Gait Ambulation/Gait assistance: Min guard Ambulation Distance (Feet): 165 Feet Assistive device: Rolling walker (2 wheeled) Gait Pattern/deviations: Step-through pattern   Gait velocity interpretation:  (below patients baseline) General Gait Details:  (Unable to fully extend  RLE and place heel on floor; chronic)   Stairs            Wheelchair Mobility    Modified Rankin (Stroke Patients Only)       Balance Overall balance assessment: No apparent balance deficits (not formally assessed)                                  Cognition Arousal/Alertness: Awake/alert Behavior During Therapy: WFL for tasks assessed/performed Overall Cognitive Status: Within Functional Limits for tasks assessed       Memory:  (Remembers all 3 hip precautions without assist)              Exercises Total Joint Exercises Ankle Circles/Pumps: AROM;Both;20 reps;Supine Quad Sets: Strengthening;Right;Supine;Other reps (comment) (12) Gluteal Sets: Strengthening;Both;Other reps (comment);Supine (12) Towel Squeeze: Strengthening;Supine;Both;Other reps (comment) (12) Short Arc Quad: AAROM;Right;Other reps (comment);Supine (12) Heel Slides: AAROM;Right;Other reps (comment);Supine (12) Hip ABduction/ADduction: AAROM;Right;Other reps (comment);Supine (12)    General Comments        Pertinent Vitals/Pain Pain Assessment: 0-10 Pain Score: 3  Pain Location: R hip Pain Descriptors / Indicators: Aching Pain Intervention(s): Premedicated before session;Monitored during session    Home Living                      Prior Function            PT Goals (current goals can now be found in the care plan section) Progress towards PT goals: Progressing toward goals    Frequency  BID    PT Plan Current plan remains appropriate;Equipment recommendations need to be updated  Co-evaluation             End of Session Equipment Utilized During Treatment: Gait belt Activity Tolerance: Patient tolerated treatment well;No increased pain Patient left: Other (comment) (pt required time in the bathroom; nsg and nsg asst aware)     Time: 4098-1191 PT Time Calculation (min) (ACUTE ONLY): 29 min  Charges:  $Gait Training: 8-22 mins $Therapeutic  Exercise: 8-22 mins                    G Codes:      Kristeen Miss 11/02/2014, 3:11 PM

## 2014-11-02 NOTE — Discharge Instructions (Signed)

## 2014-11-03 NOTE — Care Management (Signed)
Patient discharging  Home today. I have notified Judeth Cornfield with [facility] of patient discharge. No further RNCM needs. Case closed.

## 2014-11-03 NOTE — Discharge Summary (Signed)
Physician Discharge Summary  Patient ID: Colton [name] MRN: [id number] DOB/AGE: 06-30-1975 39 y.o.  Admit date: 10/31/2014 Discharge date: 11/03/2014  Admission Diagnoses:  OSTEOARTHRITIS   Discharge Diagnoses: Patient Active Problem List   Diagnosis Date Noted  . S/P total hip arthroplasty 10/31/2014  . Cerebral palsy (HCC) 08/16/2014  . Degenerative joint disease (DJD) of hip 08/16/2014    Past Medical History  Diagnosis Date  . Cerebral palsy (HCC)   . Osteoarthritis   . Chronic pain   . PONV (postoperative nausea and vomiting)      Transfusion: no transfusion give during this admission.   Consultants (if any):  case management  Discharged Condition: Improved  Hospital Course: Colton [name] is an 39 y.o. male who was admitted 10/31/2014 with a diagnosis of degenerative arthrosis of the right hip and went to the operating room on 10/31/2014 and underwent the above named procedures.    Surgeries:Procedure(s): TOTAL HIP ARTHROPLASTY on 10/31/2014  PROCEDURE: Right total hip arthroplasty  SURGEON: Colton [name]. M.D.  ASSISTANT: Colton Clines, PA (present and scrubbed throughout the case, critical for assistance with exposure, retraction, instrumentation, and closure)  ANESTHESIA: spinal  ESTIMATED BLOOD LOSS: 150 mL  FLUIDS REPLACED: 2000 mL of crystalloid  DRAINS: 2 medium drains to a Hemovac reservoir  IMPLANTS UTILIZED: DePuy 12 mm small stature AML femoral stem, 52 mm OD [employer] acetabular component, 2 - 6.5 cancellous screws, neutral [employer] polyethylene insert, and a 36 mm M-SPEC -2 mm hip ball  INDICATIONS FOR SURGERY: Colton [name] is a 39 y.o. year old male with a long history of progressive hip and groin pain. He has a history of cervical palsy and underwent previous right hip procedures for hip dysplasia. X-rays demonstrated severe degenerative changes with a slightly dysplastic acetabulum. The patient had not seen any  significant improvement despite conservative nonsurgical intervention. After discussion of the risks and benefits of surgical intervention, the patient expressed understanding of the risks benefits and agree with plans for total hip arthroplasty.   The risks, benefits, and alternatives were discussed at length including but not limited to the risks of infection, bleeding, nerve injury, stiffness, blood clots, the need for revision surgery, limb length inequality, dislocation, cardiopulmonary complications, among others, and they were willing to proceed. Patient tolerated the surgery well. No complications .Patient was taken to PACU where she was stabilized and then transferred to the orthopedic floor.  Patient started on Lovenox 30 q 12 hrs. Foot pumps applied bilaterally at 80 mm hg. Heels elevated off bed with rolled towels. No evidence of DVT. Calves non tender. Negative Homan. Physical therapy started on day #1 for gait training and transfer with OT starting on  day #1 for ADL and assisted devices. Patient has done well with therapy. Ambulated 200  feet upon being discharged. Was able to do 4 steps safely.  Patient's IV , foley discontinued on day #1 and hemovac was d/c on day #2.   He was given perioperative antibiotics:  Anti-infectives    Start     Dose/Rate Route Frequency Ordered Stop   10/31/14 1215  ceFAZolin (ANCEF) IVPB 2 g/50 mL premix     2 g 100 mL/hr over 30 Minutes Intravenous Every 6 hours 10/31/14 1212 11/01/14 0645   10/31/14 0315  ceFAZolin (ANCEF) IVPB 2 g/50 mL premix     2 g 100 mL/hr over 30 Minutes Intravenous  Once 10/31/14 0311 10/31/14 0748    .  He was fitted with AV1  compression foot pump devices, early ambulation, and TED stocking and lovenox for DVT prophylaxis.  He benefited maximally from the hospital stay and there were no complications.    Recent vital signs:  Filed Vitals:   11/03/14 0416  BP: 107/57  Pulse: 105  Temp: 99.3 F (37.4 C)  Resp:  18    Recent laboratory studies:  Lab Results  Component Value Date   HGB 12.0* 11/02/2014   HGB 12.1* 11/01/2014   HGB 15.2 10/19/2014   Lab Results  Component Value Date   WBC 8.8 11/02/2014   PLT 166 11/02/2014   Lab Results  Component Value Date   INR 0.91 10/19/2014   Lab Results  Component Value Date   NA 137 11/02/2014   K 3.6 11/02/2014   CL 106 11/02/2014   CO2 24 11/02/2014   BUN 6 11/02/2014   CREATININE 0.90 11/02/2014   GLUCOSE 97 11/02/2014    Discharge Medications:     Medication List    STOP taking these medications        acetaminophen-codeine 300-30 MG tablet  Commonly known as:  TYLENOL #3     naproxen sodium 220 MG tablet  Commonly known as:  ANAPROX     oxyCODONE-acetaminophen 5-325 MG tablet  Commonly known as:  ROXICET      TAKE these medications        albuterol 108 (90 BASE) MCG/ACT inhaler  Commonly known as:  PROVENTIL HFA;VENTOLIN HFA  Inhale 2 puffs into the lungs every 6 (six) hours as needed for wheezing or shortness of breath.     cetirizine 10 MG tablet  Commonly known as:  ZYRTEC  Take 10 mg by mouth daily.     cyclobenzaprine 10 MG tablet  Commonly known as:  FLEXERIL  Take 10 mg by mouth daily. 1/2 tab three times per day.     enoxaparin 40 MG/0.4ML injection  Commonly known as:  LOVENOX  Inject 0.4 mLs (40 mg total) into the skin daily.     oxyCODONE 5 MG immediate release tablet  Commonly known as:  Oxy IR/ROXICODONE  Take 1-2 tablets (5-10 mg total) by mouth every 4 (four) hours as needed for severe pain.     traMADol 50 MG tablet  Commonly known as:  ULTRAM  Take 1-2 tablets (50-100 mg total) by mouth every 4 (four) hours as needed for moderate pain.        Diagnostic Studies: Dg Hip Port Unilat With Pelvis 1v Right  10/31/2014   CLINICAL DATA:  Total hip arthroplasty  EXAM: DG HIP (WITH OR WITHOUT PELVIS) 1V PORT RIGHT  COMPARISON:  Portable exam 1049 hours without priors for comparison.  FINDINGS:  Components of a RIGHT hip prosthesis are present.  No acute fracture, dislocation or bone destruction.  Surgical drains present.  No periprosthetic lucency.  IMPRESSION: RIGHT hip prosthesis without acute complication identified radiographically.   Electronically Signed   By: Ulyses Southward M.D.   On: 10/31/2014 11:03    Disposition: 01-Home or Self Care      Discharge Instructions    Diet - low sodium heart healthy    Complete by:  As directed      Diet - low sodium heart healthy    Complete by:  As directed      Increase activity slowly    Complete by:  As directed      Increase activity slowly    Complete by:  As directed  Follow-up Information    Follow up with Colton Heinz, MD On 12/13/2014.   Specialty:  Orthopedic Surgery   Why:  at 10:45am   Contact information:   [address] [place] [address] [phone]        Signed: Tera [name]. 11/03/2014, 7:30 AM

## 2014-11-03 NOTE — Progress Notes (Signed)
Discharge Note:  Pts VSS, discharge instructions and prescriptions given to pt. Pt verbalized understanding. Pt wheeled to car. 

## 2014-11-03 NOTE — Progress Notes (Signed)
   Subjective: 3 Days Post-Op Procedure(s) (LRB): TOTAL HIP ARTHROPLASTY (Right) Patient reports pain as 4 on 0-10 scale.   Patient is well, and has had no acute complaints or problems Continue with physcial therapy today.  Plan is to go Home after hospital stay. no nausea and no vomiting Patient denies any chest pains or shortness of breath. Objective: Vital signs in last 24 hours: Temp:  [98.4 F (36.9 C)-99.3 F (37.4 C)] 99.3 F (37.4 C) (10/06 0416) Pulse Rate:  [92-110] 105 (10/06 0416) Resp:  [16-18] 18 (10/06 0416) BP: (107-125)/(57-83) 107/57 mmHg (10/06 0416) SpO2:  [93 %-96 %] 96 % (10/06 0416) well approximated incision Heels are non tender and elevated off the bed using rolled towels Intake/Output from previous day: 10/05 0701 - 10/06 0700 In: -  Out: 350 [Urine:350] Intake/Output this shift:     Recent Labs  11/01/14 0531 11/02/14 0534  HGB 12.1* 12.0*    Recent Labs  11/01/14 0531 11/02/14 0534  WBC 11.7* 8.8  RBC 4.06* 3.97*  HCT 35.6* 34.9*  PLT 186 166    Recent Labs  11/01/14 0531 11/02/14 0534  NA 138 137  K 3.9 3.6  CL 107 106  CO2 24 24  BUN 8 6  CREATININE 0.92 0.90  GLUCOSE 107* 97  CALCIUM 8.4* 8.5*   No results for input(s): LABPT, INR in the last 72 hours.  EXAM General - Patient is Alert, Appropriate and Oriented Extremity - Neurologically intact ABD soft Neurovascular intact Sensation intact distally Intact pulses distally Dressing - dressing C/D/I Motor Function - intact, moving foot and toes well on exam.   Past Medical History  Diagnosis Date  . Cerebral palsy (HCC)   . Osteoarthritis   . Chronic pain   . PONV (postoperative nausea and vomiting)     Assessment/Plan: 3 Days Post-Op Procedure(s) (LRB): TOTAL HIP ARTHROPLASTY (Right) Active Problems:   S/P total hip arthroplasty  Estimated body mass index is 25.84 kg/(m^2) as calculated from the following:   Height as of this encounter:  (1.676  m).   Weight as of this encounter: 72.576 kg (160 lb). Up with therapy Discharge home with home health today  Labs: none DVT Prophylaxis - Lovenox, Foot Pumps and TED hose Weight-Bearing as tolerated to right leg Needs to do steps prior to d/c dsg changed today  Toyna Erisman R. [facility] 11/03/2014, 7:14 AM

## 2014-11-03 NOTE — Progress Notes (Signed)
Physical Therapy Treatment Patient Details Name: Colton [name] MRN: [id number] DOB: January 16, 1976 Today's Date: 11/03/2014    History of Present Illness Pt was admitted to the hospital s/p R THA on 10/31/14. Pt also presents with hx of CP.     PT Comments    Pt performed stair this a.m with Min A. Demonstrates good upper body strength to perform, as pt has difficulty with weightbearing on R LE. Required seated rest post stair climbing due to fatigue. Pt does not have stairs at home that he needs to perform, but demonstrates ability to do so should he need too. Pt wished rest before ambulating with loft strand crutches. Pt given sufficient time to rest and performed ambulation with loft strand crutches with some fatigue. Pt has good awareness of limitations; discussed energy conservation and conservative progression of activity. Pt has a medical supply store close to home and feels he can purchase rolling walker if necessary. Pt to be discharged home today.   Follow Up Recommendations  Home health PT;Supervision - Intermittent     Equipment Recommendations  Rolling walker with 5" wheels (received loftstrand crutches; would benefit from rw as well)    Recommendations for Other Services       Precautions / Restrictions Precautions Precautions: Posterior Hip;Fall Restrictions Weight Bearing Restrictions: Yes RLE Weight Bearing: Weight bearing as tolerated    Mobility  Bed Mobility Overal bed mobility: Modified Independent Bed Mobility: Supine to Sit     Supine to sit: Modified independent (Device/Increase time)     General bed mobility comments: continues to use trapeze minimally  Transfers Overall transfer level: Modified independent Equipment used: Rolling walker (2 wheeled) Transfers: Sit to/from Stand Sit to Stand: Supervision            Ambulation/Gait Ambulation/Gait assistance: Supervision Ambulation Distance (Feet): 85 Feet Assistive device: Rolling walker (2  wheeled) Gait Pattern/deviations: Step-through pattern;Decreased stance time - right;Decreased weight shift to right Gait velocity:  (reduced from baseline)   General Gait Details: Less ability to place weight on RLE this a.m in part due to R knee tightness and part due to weak feeling   Stairs Stairs: Yes Stairs assistance: Min assist Stair Management: Two rails;Forwards Number of Stairs: 4 General stair comments: preferred method forward; unable to place enought weight through RLE to perform sideways with both hands on 1 rail. Pt with good upper body strength to peform steps with railings. Fatigued post stair climbing requiring seated rest for several minutes.   Wheelchair Mobility    Modified Rankin (Stroke Patients Only)       Balance Overall balance assessment: No apparent balance deficits (not formally assessed)                                  Cognition Arousal/Alertness: Awake/alert Behavior During Therapy: WFL for tasks assessed/performed Overall Cognitive Status: Within Functional Limits for tasks assessed                      Exercises Total Joint Exercises Quad Sets: Other (comment) (Pt left in bed with ankles elevated to perform QS )    General Comments        Pertinent Vitals/Pain Pain Assessment: 0-10 Pain Score: 3  Pain Location: R hip Pain Intervention(s): Premedicated before session;Limited activity within patient's tolerance    Home Living  Prior Function            PT Goals (current goals can now be found in the care plan section) Progress towards PT goals: Progressing toward goals    Frequency  BID    PT Plan Current plan remains appropriate    Co-evaluation             End of Session Equipment Utilized During Treatment: Gait belt Activity Tolerance: Patient tolerated treatment well;No increased pain;Patient limited by fatigue Patient left: in bed;with call bell/phone within  reach;with family/visitor present     Time: 2130-8657 PT Time Calculation (min) (ACUTE ONLY): 15 min  Charges:  $Gait Training: 8-22 mins                    G Codes:      Kristeen Miss 11/03/2014, 11:04 AM

## 2016-03-04 IMAGING — CR DG HIP (WITH OR WITHOUT PELVIS) 1V PORT*R*
1 series · 2 of 2 positions shown · non-contrast
Comparison: Portable exam 8346 hours without priors for comparison.

CLINICAL DATA: Total hip arthroplasty

EXAM:
DG HIP (WITH OR WITHOUT PELVIS) 1V PORT RIGHT

[Series 1: ap · 0.17mm/px · 2 of 2 slices shown]
[im 1/2]
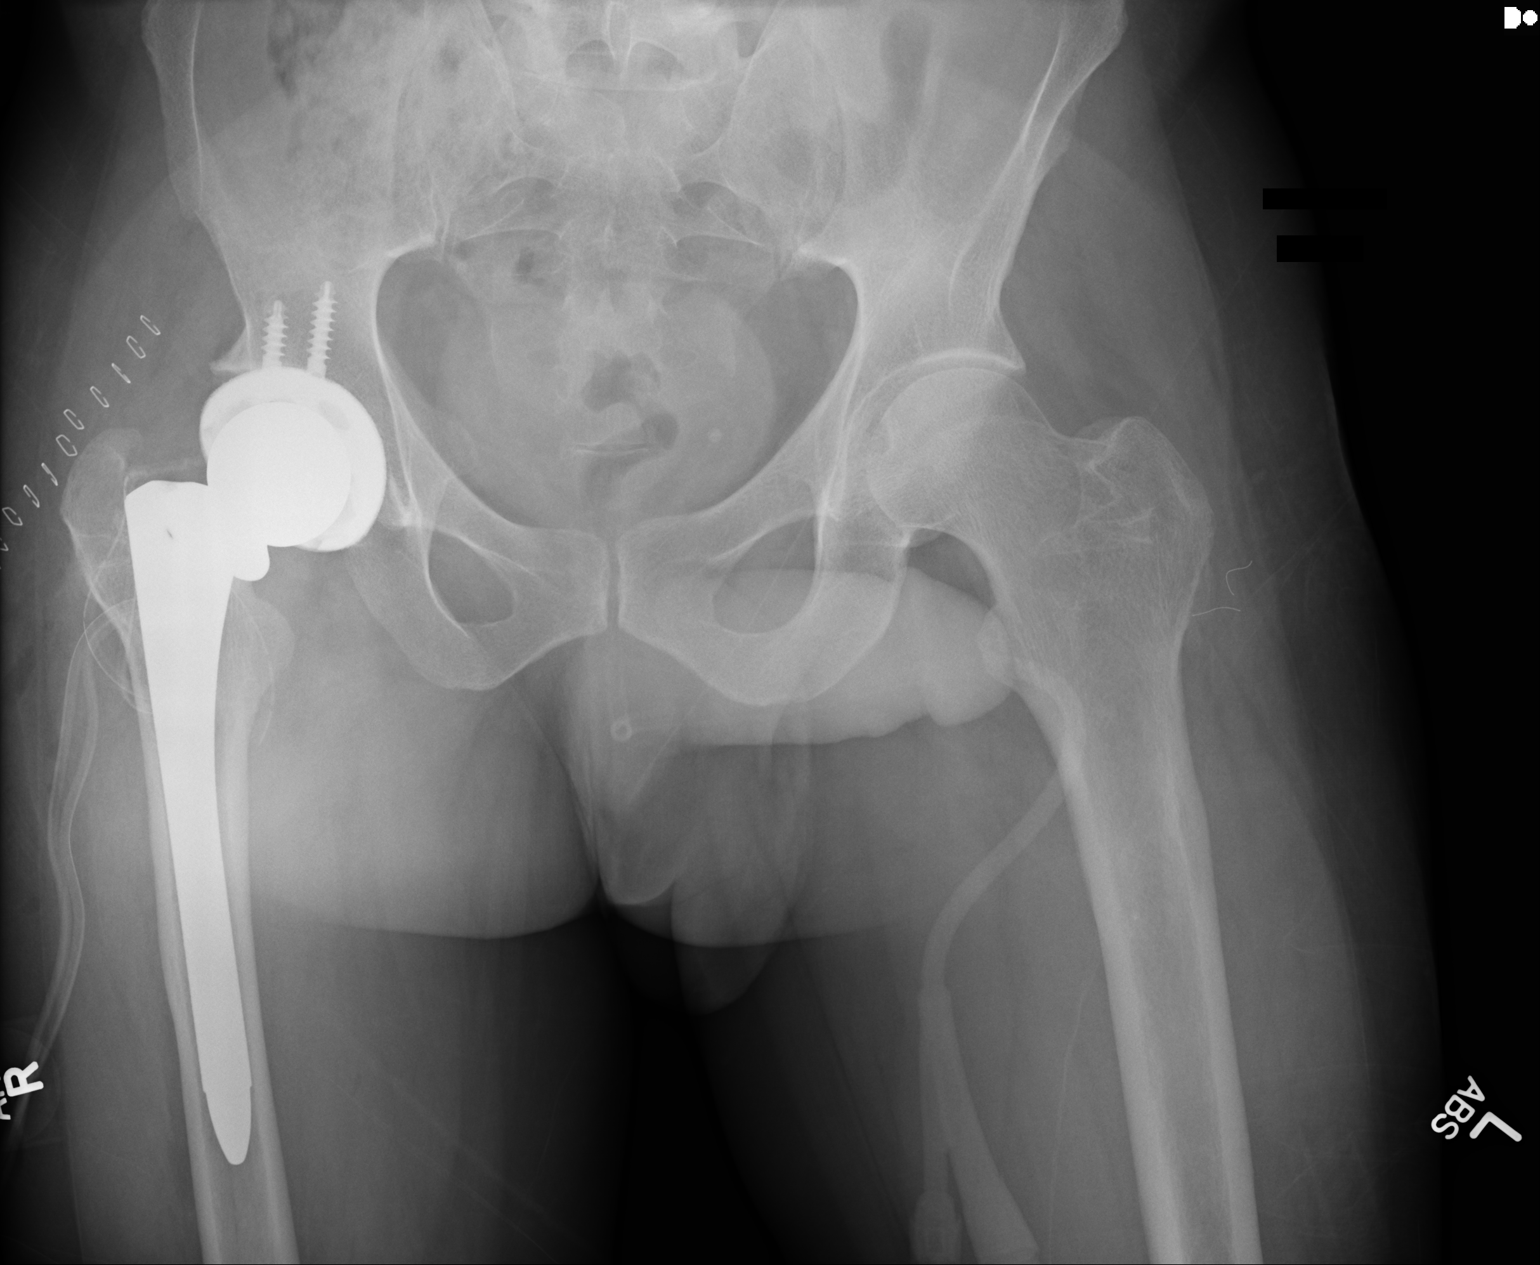
[im 2/2]
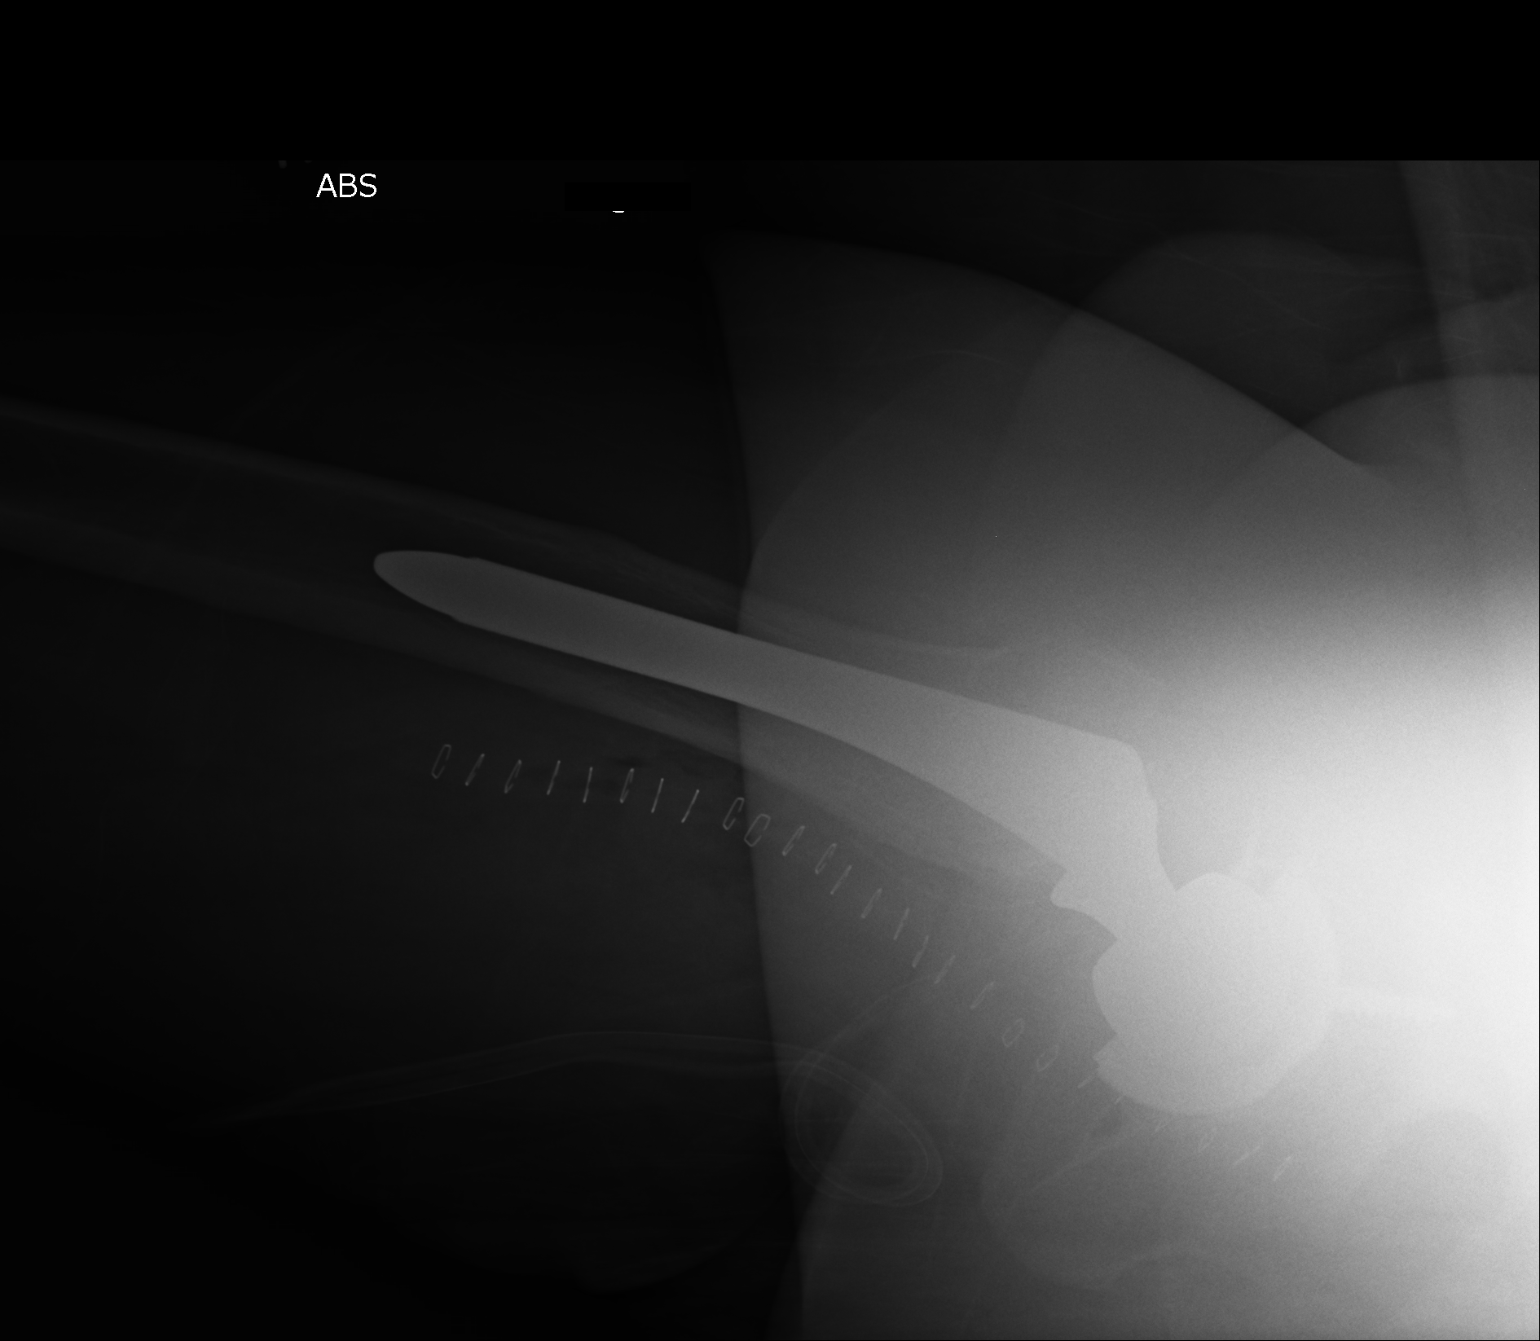

[2 of 2 positions shown; findings below may reference images not displayed]

FINDINGS: Components of a RIGHT hip prosthesis are present.

No acute fracture, dislocation or bone destruction.

Surgical drains present.

No periprosthetic lucency.
IMPRESSION: RIGHT hip prosthesis without acute complication identified
radiographically.

## 2019-08-31 ENCOUNTER — Ambulatory Visit
Admission: AD | Admit: 2019-08-31 | Discharge: 2019-08-31 | Disposition: A | Discharge status: Home / Self Care | Payer: 59 | Source: Ambulatory Visit | Attending: Emergency Medicine | Admitting: Emergency Medicine

## 2019-08-31 DIAGNOSIS — R112 Nausea with vomiting, unspecified: Secondary | ICD-10-CM | POA: Insufficient documentation

## 2019-08-31 HISTORY — DX: Cerebral palsy, unspecified: G80.9

## 2019-08-31 LAB — POCT URINALYSIS DIPSTICK
Bilirubin,Ur: NEGATIVE
Glucose,UA POCT: NORMAL mg/dL
Ketones,UA POCT: NEGATIVE mg/dL
Lot #: 53600802
Nitrite,UA POCT: NEGATIVE
PH,UA POCT: 7 (ref 5–8)
Specific gravity,UA POCT: 1.005 (ref 1.002–1.030)
Urobilinogen,UA: NORMAL mg/dL

## 2019-08-31 MED ORDER — ONDANSETRON 4 MG PO TBDP *I*
4.0000 mg | ORAL_TABLET | Freq: Three times a day (TID) | ORAL | 0 refills | Status: DC | PRN
Start: 2019-08-31 — End: 2021-05-03

## 2019-08-31 MED ORDER — SODIUM CHLORIDE 0.9 % IV BOLUS *I*
1000.0000 mL | Freq: Once | Status: AC
Start: 2019-08-31 — End: 2019-08-31
  Administered 2019-08-31: 1000 mL via INTRAVENOUS

## 2019-08-31 MED ORDER — SODIUM CHLORIDE 0.9 % IV BOLUS *I*
500.0000 mL | Freq: Once | Status: AC
Start: 2019-08-31 — End: 2019-08-31
  Administered 2019-08-31: 500 mL via INTRAVENOUS

## 2019-08-31 MED ORDER — ONDANSETRON HCL 2 MG/ML IV SOLN *I*
4.0000 mg | Freq: Once | INTRAMUSCULAR | Status: AC
Start: 2019-08-31 — End: 2019-08-31
  Administered 2019-08-31: 4 mg via INTRAVENOUS

## 2019-08-31 MED ORDER — ONDANSETRON 4 MG PO TBDP *I*
4.0000 mg | ORAL_TABLET | Freq: Three times a day (TID) | ORAL | 0 refills | Status: DC | PRN
Start: 2019-08-31 — End: 2019-08-31

## 2019-08-31 MED ORDER — FAMOTIDINE (PF) 20 MG/2ML IV SOLN *I*
20.0000 mg | Freq: Once | INTRAVENOUS | Status: AC
Start: 2019-08-31 — End: 2019-08-31
  Administered 2019-08-31: 20 mg via INTRAVENOUS

## 2019-08-31 NOTE — Discharge Instructions (Signed)
You should go home and rest I have sent a prescription for the nausea to your pharmacy. You should go home and rest and may sip on clear liquids Once you are able to tolerate them you may try some crackers, toast, and a light diet. If the vomiting returns or you develop abdominal pains then you should go to the Emergency room for further evaluation

## 2019-08-31 NOTE — UC Provider Note (Signed)
History     Chief Complaint   Patient presents with    Abdominal Pain     Patient has cerebral palsy.    Emesis     Patient c/o nausea and vomitting since 10am this morning.      Patient is a 44 year old male with history of mild cerebral palsy and some chronic pain related to that who is here today for onset of nausea, vomiting that started last night, He says was having increased pain in his hip and took some left over Tramadol that he had from 2018. He says then later started feeling very nauseated and this morning started to vomit and has not been able to keep anything down, He recently relocated to the area from [place] in May and has not yet established with a PCP He denies any recent travel He has not had any diarrhea He denies any URI symptoms. He had kidney surgery in the past but no other abdominal surgeries,. He denies history of Urinary tract infections. He has not come in contact with anyone with COVID          Medical/Surgical/Family History     Past Medical History:   Diagnosis Date    Cerebral palsy         There is no problem list on file for this patient.           Past Surgical History:   Procedure Laterality Date    HIP REPLACEMENT      KIDNEY SURGERY       No family history on file.       Social History     Tobacco Use    Smoking status: Not on file   Substance Use Topics    Alcohol use: Not on file    Drug use: Not on file     Living Situation     Questions Responses    Patient lives with     Homeless     Caregiver for other family member     External Services     Employment     Domestic Violence Risk                 Review of Systems   Review of Systems   Constitutional: Positive for chills and diaphoresis.   HENT: Negative for congestion.    Respiratory: Negative for cough and shortness of breath.    Gastrointestinal: Positive for nausea and vomiting. Negative for constipation and diarrhea.   Genitourinary: Negative for dysuria, flank pain, penile pain, penile swelling,  scrotal swelling and testicular pain.   Skin: Negative for rash.       Physical Exam   Triage Vitals      First Recorded BP: 129/64, Resp: 18, Temp: 36.6 C (97.9 F), Temp src: TEMPORAL Oxygen Therapy SpO2: 98 %, O2 Device: None (Room air), Heart Rate: 84, (08/31/19 1326)  .      Physical Exam  Vitals and nursing note reviewed.   Constitutional:       General: He is in acute distress (patient appears uncomfortable, laying on side holdine emesisi basin).      Appearance: He is ill-appearing.   HENT:      Head: Normocephalic and atraumatic.   Cardiovascular:      Rate and Rhythm: Normal rate and regular rhythm.      Heart sounds: Normal heart sounds.   Pulmonary:      Effort: Pulmonary effort is normal.  Abdominal:      General: Bowel sounds are normal.      Tenderness: There is no abdominal tenderness. There is no right CVA tenderness, left CVA tenderness, guarding or rebound. Negative signs include Murphy's sign and McBurney's sign.      Hernia: A hernia is present. Hernia is present in the umbilical area (small umbilical hernia,easily reducible).   Genitourinary:     Penis: Normal.       Testes: Normal.   Skin:     General: Skin is warm and dry.   Neurological:      General: No focal deficit present.      Mental Status: He is alert.   Psychiatric:         Mood and Affect: Mood normal.          Medical Decision Making        Medical Decision Making  Assessment:    Patient is a 44 year old male with cerebral palsy with onset of nausea, vomiting since last night  No fevers, no diarrhea , no abdominal pain    Differential diagnosis:    Viral gastroenteritis  Food poisoning  Gastritis  Gerd    Diagnosis and Disposition:       Return precautions discussed and provided on AVS.    Discharge Medications  Current Discharge Medication List    New Medications    ondansetron (ZOFRAN-ODT) 4 mg  Dose: 4 mg  Take 4 mg by mouth 3 times daily as needed for Vomiting Place on top of tongue.  Quantity 10 tablet Refill 0  Start date:  08/31/2019  Comments: Emergency Encounter        Follow-up  @AVSFOLLOWUP @  Patient Instructions   .       You should go home and rest I have sent a prescription for the nausea to   your pharmacy. You should go home and rest and may sip on clear liquids   Once you are able to tolerate them you may try some crackers, toast, and a   light diet. If the vomiting returns or you develop abdominal pains then   you should go to the Emergency room for further evaluation      Final Diagnosis  Final diagnoses:   None         , MD       Orders Placed This Encounter    Bacterial urine culture    POCT urinalysis dipstick    Insert peripheral IV    sodium chloride 0.9 % bolus 1,000 mL    ondansetron (ZOFRAN) injection 4 mg    famotidine (PEPCID) 20 MG/2ML injection 20 mg    ondansetron (ZOFRAN) injection 4 mg    sodium chloride 0.9 % bolus 500 mL    ondansetron (ZOFRAN-ODT) 4 MG disintegrating tablet       Recent Results (from the past 24 hour(s))   POCT urinalysis dipstick    Collection Time: 08/31/19  2:35 PM   Result Value Ref Range    Specific gravity,UA POCT 1.005 1.002 - 1.030    PH,UA POCT 7.0 5 - 8    Leuk Esterase,UA POCT Trace (!) Negative    Nitrite,UA POCT Negative Negative    Protein,UA POCT Trace (!) Negative mg/dL    Glucose,UA POCT Normal Normal mg/dL    Ketones,UA POCT Negative Negative mg/dL    Urobilinogen,UA Normal Less than 1 mg/dL    Bilirubin,Ur Negative Negative, Test Not Performed    Blood,UA  POCT About 50 (!) Negative    Exp date 10/27/2020     Lot # [id number]    Bacterial urine culture    Collection Time: 08/31/19  2:35 PM    Specimen: Urine (Clean catch, voided, midstream)   Result Value Ref Range    Aerobic Culture .            Final Diagnosis    ICD-10-CM ICD-9-CM   1. Non-intractable vomiting with nausea, unspecified vomiting type  R11.2 787.01       Encourage fluids, encourage rest, good hand hygiene.    Use over the counter medications as discussed.    Please start the new  medications as below:    Current Discharge Medication List      New Medications    Details Last Dose Given Next Dose Due Script Given?   ondansetron (ZOFRAN-ODT) 4 mg Dose: 4 mg  Take 4 mg by mouth 3 times daily as needed for Vomiting Place on top of tongue.  Quantity 10 tablet, Refill 0  Start date: 08/31/2019       Comments: Emergency Encounter                   Please follow up with your physician as below:        Thank you Littie Deeds for coming to [facility] for your health care concerns.    If your condition changes and/or worsens please follow up with her primary doctor and/or return to the urgent care center.    If short of breath, chest pains or any other concerns please report to the emergency room.    In the event of an Emergency dial 911.       Delorise Jackson, MD  09/01/19 [phone]

## 2019-08-31 NOTE — ED Triage Notes (Signed)
Patient c/o nausea and vomitting since 10am this morning.     Triage Note   Salomon Fick, RN

## 2019-09-01 ENCOUNTER — Telehealth: Payer: Self-pay | Admitting: Emergency Medicine

## 2019-09-01 NOTE — Telephone Encounter (Signed)
[facility] ED/UC Post-Visit Patient Call     Visit location: [place] [facility]                     Date of service: 08/31/2019    Contact made: 09/01/2019    Results of Post-Visit Patient Call: patient agrees to briefly discuss recent visit     If we provided you with a prescription were you able to fill it?:yes    If testing was ordered, were results discussed? Yes    If we provided you with a referral, were you able to make an appointment?:N/A    Do you have any questions for the RN/Provider regarding your care?: No    Do you have any concerns or would you like to be contacted regarding your visit?:No    Delorise Jackson, MD on 09/01/2019 at 12:42 PM     Seward Carol Beatris Si, MD  09/01/19 1243

## 2019-09-02 LAB — AEROBIC CULTURE: Aerobic Culture: 0

## 2019-09-10 ENCOUNTER — Encounter: Payer: Self-pay | Admitting: Adult Health

## 2019-09-10 NOTE — Progress Notes (Signed)
Error: Prechart, patient no-show

## 2019-10-01 ENCOUNTER — Encounter: Payer: Self-pay | Admitting: Primary Care

## 2019-10-01 ENCOUNTER — Ambulatory Visit: Payer: 59 | Admitting: Primary Care

## 2019-10-01 VITALS — BP 102/70 | HR 85 | Temp 96.0°F | Ht 66.0 in | Wt 175.1 lb

## 2019-10-01 DIAGNOSIS — Z7689 Persons encountering health services in other specified circumstances: Secondary | ICD-10-CM

## 2019-10-01 DIAGNOSIS — J309 Allergic rhinitis, unspecified: Secondary | ICD-10-CM

## 2019-10-01 DIAGNOSIS — M2170 Unequal limb length (acquired), unspecified site: Secondary | ICD-10-CM

## 2019-10-01 DIAGNOSIS — Z131 Encounter for screening for diabetes mellitus: Secondary | ICD-10-CM

## 2019-10-01 DIAGNOSIS — E785 Hyperlipidemia, unspecified: Secondary | ICD-10-CM

## 2019-10-01 DIAGNOSIS — J3489 Other specified disorders of nose and nasal sinuses: Secondary | ICD-10-CM

## 2019-10-01 DIAGNOSIS — G809 Cerebral palsy, unspecified: Secondary | ICD-10-CM

## 2019-10-01 DIAGNOSIS — Z6828 Body mass index (BMI) 28.0-28.9, adult: Secondary | ICD-10-CM | POA: Insufficient documentation

## 2019-10-01 MED ORDER — FLUTICASONE PROPIONATE 50 MCG/ACT NA SUSP *I*
2.0000 | Freq: Every day | NASAL | 2 refills | Status: DC
Start: 2019-10-01 — End: 2019-11-19

## 2019-10-01 NOTE — Progress Notes (Signed)
New Patient Visit    SUBJECTIVE    NEVYN BOSSMAN is a 44 y.o. male here to establish care, and has the following concerns today, including:    Chief Complaint   Patient presents with    New Patient Visit     Presents to establish care.  Previous PCP was in [place], last seen in March 2021.     Hyperlipidemia: Continues on rosuvastatin 20 mg daily.  Denies muscle aches.    GERD: Takes omeprazole 20 mg daily.  Controlled.    Allergic rhinitis: On cetirizine 10 mg daily.    Cereral palsy: mild. Affected right side of body. Was due to premature birth.  He does have some leg length discrepancy and has required a right foot lift in the past.    Right ear/sinus pain: Notes that over the past few months or longer, has been developing pain in the right maxillary sinus/ear region.  Specifically seems to be worse when he lays down on his right side.  Denies significant drainage or hearing loss.    ROS  -As per HPI    Home Medications  Prior to Admission medications    Medication Sig Start Date End Date Taking? Authorizing Provider   omeprazole (PRILOSEC) 20 MG capsule Take 20 mg by mouth daily (before breakfast)    [provider]   cetirizine (ZYRTEC) 10 MG tablet Take 10 mg by mouth daily    [provider]   rosuvastatin (CRESTOR) 20 MG tablet Take 20 mg by mouth daily    [provider]   ondansetron (ZOFRAN-ODT) 4 MG disintegrating tablet Take 1 tablet (4 mg total) by mouth 3 times daily as needed for Vomiting for up to 10 doses Place on top of tongue. 08/31/19   Concha Pyo, PA     Allergies:   Allergies   Allergen Reactions    Shellfish-Derived Products Itching     Scratchy throat to shrimp     PMH:   Past Medical History:   Diagnosis Date    Cerebral palsy     GERD (gastroesophageal reflux disease)     Hyperlipidemia       PSH:   Past Surgical History:   Procedure Laterality Date    HIP REPLACEMENT Right     KIDNEY SURGERY        FHx:   Family History   Problem Relation Age  of Onset    Asthma Mother     Hyperlipidemia Father     Hypertension Father     Diabetes Father     Asthma Brother       SocHx:   Social History     Socioeconomic History    Marital status: Married     Spouse name: Not on file    Number of children: Not on file    Years of education: Not on file    Highest education level: Not on file   Occupational History    Not on file   Tobacco Use    Smoking status: Former Smoker     Packs/day: 0.25     Years: 20.00     Pack years: 5.00     Types: Cigarettes     Quit date: 2015     Years since quitting: 6.6    Smokeless tobacco: Never Used   Substance and Sexual Activity    Alcohol use: Yes     Comment: monthly    Drug use: Not Currently  Sexual activity: Yes     Partners: Female     Birth control/protection: None   Social History Narrative    Not on file          The patient's current medications, allergies, past medical history, family history, social history, and problem list were personally reviewed with the patient and in Epic today.     OBJECTIVE  Vitals:    10/01/19 0927   BP: 102/70   BP Location: Left arm   Patient Position: Sitting   Cuff Size: adult   Pulse: 85   Temp: 35.6 C (96 F)   TempSrc: Temporal   SpO2: 97%   Weight: 79.4 kg (175 lb 1.6 oz)   Height: 1.676 m (5\' 6" )       Physical Exam  Vitals and nursing note reviewed.   Constitutional:       General: He is not in acute distress.     Appearance: He is well-developed. He is not diaphoretic.   HENT:      Head: Normocephalic and atraumatic.      Right Ear: Tympanic membrane, ear canal and external ear normal. There is no impacted cerumen.      Left Ear: Tympanic membrane, ear canal and external ear normal. There is no impacted cerumen.      Ears:      Comments: The right nasal passage does appear narrow with possible polyp.  Eyes:      General: No scleral icterus.        Right eye: No discharge.         Left eye: No discharge.      Conjunctiva/sclera: Conjunctivae normal.   Cardiovascular:       Rate and Rhythm: Normal rate.   Pulmonary:      Effort: Pulmonary effort is normal.   Neurological:      Mental Status: He is alert.   Psychiatric:         Behavior: Behavior normal.         Thought Content: Thought content normal.         ASSESSMENT & PLAN    1. Encounter to establish care  Previous PCP records not available at time of visit.  I will follow up when I obtain them.    2. BMI 28.0-28.9,adult  - Lipid Panel (Reflex to Direct  LDL if Triglycerides more than 400); Future  - Comprehensive metabolic panel; Future  - Hemoglobin A1c; Future    3. Hyperlipidemia, unspecified hyperlipidemia type  Continue rosuvastatin 20 mg daily.  Has not had a cholesterol level checked since being on this dosage.  Updated level ordered to monitor.  - Lipid Panel (Reflex to Direct  LDL if Triglycerides more than 400); Future  - Comprehensive metabolic panel; Future  - Hemoglobin A1c; Future    4. Diabetes mellitus screening  - Lipid Panel (Reflex to Direct  LDL if Triglycerides more than 400); Future  - Comprehensive metabolic panel; Future  - Hemoglobin A1c; Future    5. Sinus pain  Right-sided sinus and ear pain.  The right nasal passages appear narrowed and may have a nasal polyp which could be contributing to symptoms.  Recommend evaluation with ENT, he agrees.  Referral signed.  In the interim he will continue cetirizine daily and try intranasal Flonase to see if these help alleviate symptoms to some extent.  - AMB REFERRAL TO OTOLARYNGOLOGY  - fluticasone (FLONASE) 50 MCG/ACT nasal spray; Spray 2 sprays into nostril daily  Dispense: 16 g; Refill: 2    6. Allergic rhinitis  - fluticasone (FLONASE) 50 MCG/ACT nasal spray; Spray 2 sprays into nostril daily  Dispense: 16 g; Refill: 2    7. Cerebral palsy, unspecified type  Mild, has some right-sided motor weakness.  -He is going to contact DMV about transferring handicap plates, will send paperwork to me if needed.    8. Leg length discrepancy  -May need new lift for his  shoe.  He is going to reach out to an orthotic center and will let me know if prescription is needed.      Return in about 3 months (around 12/31/2019) for Follow-up hld, sinuses.    Colbert Ewing, MD  [facility] [place]  10/01/2019

## 2019-11-19 ENCOUNTER — Ambulatory Visit: Payer: 59 | Admitting: Otolaryngology

## 2019-11-19 ENCOUNTER — Encounter: Payer: Self-pay | Admitting: Otolaryngology

## 2019-11-19 VITALS — BP 121/84 | HR 78 | Temp 98.0°F | Ht 66.0 in | Wt 179.0 lb

## 2019-11-19 DIAGNOSIS — J310 Chronic rhinitis: Secondary | ICD-10-CM

## 2019-11-19 DIAGNOSIS — R448 Other symptoms and signs involving general sensations and perceptions: Secondary | ICD-10-CM

## 2019-11-19 DIAGNOSIS — J329 Chronic sinusitis, unspecified: Secondary | ICD-10-CM

## 2019-11-19 DIAGNOSIS — J3489 Other specified disorders of nose and nasal sinuses: Secondary | ICD-10-CM

## 2019-11-19 MED ORDER — MOMETASONE FUROATE 50 MCG/ACT NA SUSP *I*
2.0000 | Freq: Every day | NASAL | 5 refills | Status: AC
Start: 2019-11-19 — End: ?

## 2019-11-19 MED ORDER — FEXOFENADINE HCL 180 MG PO TABS *I*
180.0000 mg | ORAL_TABLET | Freq: Every day | ORAL | 11 refills | Status: AC
Start: 2019-11-19 — End: ?

## 2019-11-19 NOTE — Progress Notes (Signed)
Reason for Visit  Facial and nasal congestion while laying down    HPI  Austin Rice is a 44 y.o. male who presents with longstanding history of nasal congestion as well as ear pressure/fullness when laying down.  Patient denies any symptoms while awake and up and about.  He describes a mostly right-sided sensation of ear plugging/fullness as well as nasal obstruction/congestion.  This happens frequently or most nights.  He denies any snoring or sleep apneas.  He denies any known history of teeth grinding or clenching.  He denies any frequent sinusitis or other sinonasal complaints.  He is never had nasal or sinus surgery.  Denies any history of asthma.  Patient does have a self diagnosed history of allergic rhinitis with a long family history of allergies.  He is never been formally tested.  He denies any allergy to aspirin or ibuprofen.  He denies any reaction alcohol.    Rhinology Scores 11/19/2019   SNOT-22 Score 17   NSI Score 6   SCT Score 2        Nasal Symptom Inventory  Facial or sinus pressure:  Mild   Facial or sinus pain:  Mild   Headache:  None   Nasal congestion:  Moderate   Nasal obstruction:  Moderate   Post-nasal drip:  None   Clear nasal discharge:   None   Discolored nasal discharge:  None   Itchy nose/eyes/throat:  None   Nose bleeds: None   Tiredness:  None   Wheezing:  None   Cough:  None     Allergies  He is allergic to shellfish-derived products.    PMH  He has a past medical history of Arthritis, Asthma, Cerebral palsy, GERD (gastroesophageal reflux disease), and Hyperlipidemia.    PSH  He has a past surgical history that includes Kidney surgery and hip replacement (Right).    FH  His family history includes Asthma in his brother and mother; Diabetes in his father; GERD in his mother; High Blood Pressure in his father; Hyperlipidemia in his father; Hypertension in his father.    SH  He reports that he quit smoking about 6 years ago. His smoking use included cigarettes. He has a 5.00  pack-year smoking history. He has never used smokeless tobacco. He reports current alcohol use. He reports previous drug use. Marland Kitchen    PE  Vitals BP 121/84    Pulse 78    Temp 36.7 C (98 F) (Temporal)    Ht 1.676 m (5\' 6" )    Wt 81.2 kg (179 lb)    BMI 28.89 kg/m    General General appearance normal. Alert and cooperative. Voice normal.   Head/Face Normocephalic; atraumatic. No facial skin lesions. No maxillary tenderness. .   Eyes Extraocular muscles intact. No nystagmus with lateral gaze.   Ears, Nose, Mouth & Throat Normal EAC. Normal TM appearance. Normal TM mobility.   Clinical speech thresholds normal.   External ears normal. External nose normal. Nasal mucosa normal. Septum relatively midline. Inferior turbinates normal. No polyps. No nasal discharge.large edematous are in middle meatus on left, see endoscopy note for claification  Lips, teeth, and gums normal. Oral mucosa moist, without lesions. Tongue and FOM without masses. Palate and uvula without lesions and with symmetric elevation. Tonsils symmetric without lesions. Oropharynx clear.   Neck No masses. No palpable cervical adenopathy.   Respiratory Normal respiratory effort. No retractions.   Cardiovascular Upper extremities well-perfused.   Neuro/Psych CNII-XII grossly normal. Alert and oriented.  Mood and affect normal.     Results  None    PROCEDURE  Bilateral nasal endoscopy.    INDICATIONS  CRS    DESCRIPTION  Informed consent was obtained. The time-out was performed. Topical tetracaine and oxymetazoline were applied. Bilateral nasal endoscopy was performed with a 3 mm 30 degree rigid endoscope. The patient tolerated the procedure well without apparent complications.    Left -inferior turbinate decongested well with Afrin.  Middle turbinate is very large likely concha bullosa and is mostly polypoid degenerated with polypoid changes at the inferior aspect from the anterior face posteriorly curving towards the posterior attachment laterally.  No other  polyps or lesions.  The sphenoethmoid recess is clear.    Right -inferior turbinate is normal.  The middle meatus is clear.  The middle turbinate on the side is normal.  The sphenoethmoid recess is clear.    Eustachian tube openings appeared normal bilaterally.        Assessment  1. Ear pressure  2. Facial pressure  3. 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  Interesting that this is more normal side.  Surgery to explain his right-sided ear and nose symptoms from his endoscopy today.  It is possible that he has had teeth grinding or clenching while he is sleeping and has not been noticed to this time.  Worth ruling out true eustachian tube dysfunction with an audiogram with tympanometry.  We also discussed the benefit of CT scan because sure were not missing hidden sinus disease and also better characterize the left abnormal middle turbinate.  He was strongly in favor of this plan.  We will switch his allergy regimen to Nasonex and Allegra in hopes that this better helps his symptoms and overall helps control his allergies better.   Return to clinic in 3 months, or sooner if symptoms worsen.    Sincerely,    Marcello Moores L. Winifred Olive, MD  Assistant Professor  Rhinology and Anterior Skull Base Surgery  Department of Otolaryngology Head and Neck Surgery  [facility]

## 2019-11-22 NOTE — Progress Notes (Signed)
Booked CT Sinus w/o contrast at [facility]  11.9.21  4:00PM (NPO 2 hrs)  Dr. Winifred Olive to call results--Patient to call if he does not hear after 4 business days F/U  1.19.22  8:35AM   Audio 8AM  Central to obtain auth  Confirmed Pt   10.25.21  2:35PM   [facility]

## 2019-12-07 ENCOUNTER — Ambulatory Visit
Admission: RE | Admit: 2019-12-07 | Discharge: 2019-12-07 | Disposition: A | Discharge status: Home / Self Care | Payer: 59 | Source: Ambulatory Visit | Attending: Otolaryngology | Admitting: Otolaryngology

## 2019-12-07 DIAGNOSIS — J329 Chronic sinusitis, unspecified: Secondary | ICD-10-CM | POA: Insufficient documentation

## 2019-12-07 DIAGNOSIS — J310 Chronic rhinitis: Secondary | ICD-10-CM

## 2019-12-31 ENCOUNTER — Encounter: Payer: Self-pay | Admitting: Primary Care

## 2019-12-31 ENCOUNTER — Ambulatory Visit: Payer: 59 | Admitting: Primary Care

## 2019-12-31 ENCOUNTER — Other Ambulatory Visit
Admission: RE | Admit: 2019-12-31 | Discharge: 2019-12-31 | Disposition: A | Discharge status: Home / Self Care | Payer: 59 | Source: Ambulatory Visit | Attending: Primary Care | Admitting: Primary Care

## 2019-12-31 VITALS — BP 126/78 | HR 88 | Temp 97.0°F | Ht 66.0 in | Wt 175.7 lb

## 2019-12-31 DIAGNOSIS — M545 Low back pain, unspecified: Secondary | ICD-10-CM

## 2019-12-31 DIAGNOSIS — E785 Hyperlipidemia, unspecified: Secondary | ICD-10-CM

## 2019-12-31 DIAGNOSIS — Z131 Encounter for screening for diabetes mellitus: Secondary | ICD-10-CM | POA: Insufficient documentation

## 2019-12-31 DIAGNOSIS — J3489 Other specified disorders of nose and nasal sinuses: Secondary | ICD-10-CM

## 2019-12-31 DIAGNOSIS — Z6828 Body mass index (BMI) 28.0-28.9, adult: Secondary | ICD-10-CM | POA: Insufficient documentation

## 2019-12-31 LAB — COMPREHENSIVE METABOLIC PANEL
ALT: 52 U/L — ABNORMAL HIGH (ref 0–50)
AST: 34 U/L (ref 0–50)
Albumin: 4.8 g/dL (ref 3.5–5.2)
Alk Phos: 129 U/L (ref 40–130)
Anion Gap: 14 (ref 7–16)
Bilirubin,Total: 0.5 mg/dL (ref 0.0–1.2)
CO2: 21 mmol/L (ref 20–28)
Calcium: 9.5 mg/dL (ref 9.0–10.3)
Chloride: 102 mmol/L (ref 96–108)
Creatinine: 1.04 mg/dL (ref 0.67–1.17)
GFR,Black: 100 *
GFR,Caucasian: 86 *
Glucose: 92 mg/dL (ref 60–99)
Lab: 15 mg/dL (ref 6–20)
Potassium: 4.2 mmol/L (ref 3.3–5.1)
Sodium: 137 mmol/L (ref 133–145)
Total Protein: 8.2 g/dL — ABNORMAL HIGH (ref 6.3–7.7)

## 2019-12-31 LAB — LIPID PANEL
Chol/HDL Ratio: 3.8
Cholesterol: 179 mg/dL
HDL: 47 mg/dL (ref 40–60)
LDL Calculated: 104 mg/dL
Non HDL Cholesterol: 132 mg/dL
Triglycerides: 141 mg/dL

## 2019-12-31 NOTE — Progress Notes (Signed)
Follow Up Visit    Austin Rice is a 44 y.o. male presents today for   Chief Complaint   Patient presents with    Hyperlipidemia    Back Pain     left sided, lower;  12/26/2019       HPI  Hyperlipidemia: Continues on rosuvastatin 20 mg daily.  Did not yet get updated lipid panel done.  Tolerating medication well without any side effects noted such as muscle aches.    Sinus pain: Under further evaluation with ENT.  Continues on mometasone nasal spray and fexofenadine.  Has follow-up scheduled.    Back injury/pain: Developed pain at the left lower back region Sunday, 12/26/2019.  Was more active putting up decorations, but does not recall any specific injury or trauma to the area.  Since this time, has been having pain at the left lumbar paraspinal region.  He is trying a heating pad and taking Aleve with some improvement.  Denies new leg weakness, urine incontinence/retention, stool incontinence/retention, or numbness/tingling.    Patient Active Problem List   Diagnosis Code    BMI 28.0-28.9,adult Z68.28    Hyperlipidemia, unspecified hyperlipidemia type E78.5    Sinus pain J34.89    Allergic rhinitis J30.9     Current Outpatient Medications   Medication Sig    chlorhexidine (PERIDEX) 0.12 % solution SMARTSIG:By Mouth    albuterol HFA (PROVENTIL, VENTOLIN, PROAIR HFA) 108 (90 Base) MCG/ACT inhaler Inhale 1-2 puffs into the lungs every 6 hours as needed  Shake well before each use.    omeprazole (PRILOSEC) 20 MG capsule Take 20 mg by mouth daily (before breakfast)    rosuvastatin (CRESTOR) 20 MG tablet Take 20 mg by mouth daily    mometasone (NASONEX) 50 MCG/ACT nasal spray Spray 2 sprays into each nostril daily    fexofenadine (ALLEGRA) 180 MG tablet Take 1 tablet (180 mg total) by mouth daily    ondansetron (ZOFRAN-ODT) 4 MG disintegrating tablet Take 1 tablet (4 mg total) by mouth 3 times daily as needed for Vomiting for up to 10 doses Place on top of tongue.     Past Medical History:   Diagnosis  Date    Arthritis     Asthma     Cerebral palsy     GERD (gastroesophageal reflux disease)     Hyperlipidemia        ROS  -As per HPI    BP 126/78 (BP Location: Right arm, Patient Position: Sitting, Cuff Size: adult)    Pulse 88    Temp 36.1 C (97 F) (Temporal)    Ht 1.676 m (5\' 6" )    Wt 79.7 kg (175 lb 11.2 oz)    SpO2 96%    BMI 28.36 kg/m     Physical Exam  Vitals and nursing note reviewed.   Constitutional:       General: He is not in acute distress.     Appearance: He is well-developed. He is not diaphoretic.   HENT:      Head: Normocephalic and atraumatic.   Eyes:      General: No scleral icterus.        Right eye: No discharge.         Left eye: No discharge.      Conjunctiva/sclera: Conjunctivae normal.   Cardiovascular:      Rate and Rhythm: Normal rate.   Pulmonary:      Effort: Pulmonary effort is normal.   Musculoskeletal:  Back:    Neurological:      Mental Status: He is alert.   Psychiatric:         Behavior: Behavior normal.         Thought Content: Thought content normal.         Assessment/Plan    1. Hyperlipidemia, unspecified hyperlipidemia type  Continue rosuvastatin 20 mg daily.  Encouraged to update lab work as soon as he can for regular lipid monitoring.    2. Sinus pain  He will continue Nasonex, Allegra, and ENT follow-up.    3. Left low back pain  Recent injury and flareup.  Presentation consistent with muscle spasm today.  Presented option of as needed muscle relaxer, but he prefers not to have risk of sedation which is reasonable.  He will continue Aleve up to 500 mg twice daily, heat, and back stretches and monitor for improvement in the next few weeks.  He is aware to update me if not improving for consideration of muscle relaxer or physical therapy referral, but declines physical therapy referral right now.      Return in about 6 months (around 06/30/2020) for Annual exam.    Colbert Ewing, MD  [facility] [place]  12/31/2019

## 2019-12-31 NOTE — Patient Instructions (Signed)
Naproxen (Aleve) up to 440 mg every 12 hours as needed.   Tylenol (acetaminophen) up to 1000 mg every 8 hours as needed or 500 mg every 4 hours as needed.       Patient Education   Muscle Spasm   WHAT YOU NEED TO KNOW:   A muscle spasm is a sudden contraction of any muscle or group of muscles. A muscle cramp is a painful muscle spasm. Muscle cramps commonly occur after intense exercise or during pregnancy. They may also be caused by certain medications, dehydration, low calcium or magnesium levels, or another medical condition.   DISCHARGE INSTRUCTIONS:   Medicines:  You may need the following:   NSAIDs  help decrease swelling and pain or fever. This medicine is available with or without a doctor's order. NSAIDs can cause stomach bleeding or kidney problems in certain people. If you take blood thinner medicine, always ask your healthcare provider if NSAIDs are safe for you. Always read the medicine label and follow directions.     Take your medicine as directed.  Contact your healthcare provider if you think your medicine is not helping or if you have side effects. Tell him of her if you are allergic to any medicine. Keep a list of the medicines, vitamins, and herbs you take. Include the amounts, and when and why you take them. Bring the list or the pill bottles to follow-up visits. Carry your medicine list with you in case of an emergency.    Follow up with your healthcare provider as directed:  You may need other tests or treatment. You may also be referred to a physical therapist or other specialist. Write down your questions so you remember to ask them during your visits.   Self-care:    Stretch  your muscle to help relieve the cramp. It may be helpful to keep your muscle in the stretched position until the cramp is gone.      Apply heat  to help decrease pain and muscle spasms. Apply heat on the area for 20 to 30 minutes every 2 hours for as many days as directed.      Apply ice  to help decrease swelling  and pain. Ice may also help prevent tissue damage. Use an ice pack, or put crushed ice in a plastic bag. Cover it with a towel and place it on your muscle for 15 to 20 minutes every hour or as directed.     Drink more liquids  to help prevent muscle cramps caused by dehydration. Sports drinks may help replace electrolytes you lose through sweat during exercise. Ask your healthcare provider how much liquid to drink each day and which liquids are best for you.      Eat healthy foods , such as fruits, vegetables, whole grains, low-fat dairy products, and lean proteins (meat, beans, and fish). If you are pregnant, ask your healthcare provider about foods that are high in magnesium and sodium. They may help to relieve cramps during pregnancy.      Massage your muscle  to help relieve the cramp.      Take frequent deep breaths  until the cramp feels better. Lie down while you take the deep breaths so you do not get dizzy or lightheaded.    Contact your healthcare provider if:    You have signs of dehydration, such as a headache, dark yellow urine, dry eyes or mouth, or a fast heartbeat.      You have questions or  concerns about your condition or care.    Return to the emergency department if:    You have warmth, swelling, or redness in the cramping muscle.      You have frequent or unrelieved muscle cramps in several different muscles.      You have muscle cramps with numbness, tingling, and burning in your hands and feet.     Copyright [employer] Information is for [employer] use only and may not be sold, redistributed or otherwise used for commercial purposes. All illustrations and images included in CareNotes are the copyrighted property of A.D.A.M., Inc. or [employer] [facility]  The above information is an educational aid only. It is not intended as medical advice for individual conditions or treatments. Talk to your doctor, nurse or pharmacist before following any medical regimen to see if it is  safe and effective for you.

## 2020-01-02 LAB — HEMOGLOBIN A1C: Hemoglobin A1C: 5.7 % — ABNORMAL HIGH

## 2020-01-03 ENCOUNTER — Encounter: Payer: Self-pay | Admitting: Primary Care

## 2020-01-03 DIAGNOSIS — R7303 Prediabetes: Secondary | ICD-10-CM | POA: Insufficient documentation

## 2020-01-05 ENCOUNTER — Other Ambulatory Visit: Payer: Self-pay | Admitting: Primary Care

## 2020-01-05 DIAGNOSIS — J3489 Other specified disorders of nose and nasal sinuses: Secondary | ICD-10-CM

## 2020-01-05 DIAGNOSIS — J309 Allergic rhinitis, unspecified: Secondary | ICD-10-CM

## 2020-01-05 NOTE — Telephone Encounter (Signed)
ENT switched him to mometasone nasal spray instead which he is using. Fluticasone refill refused.

## 2020-01-05 NOTE — Telephone Encounter (Signed)
Last Appointment: 12/31/2019  Next Appointment:   Future Appointments   Date Time Provider Department Center   02/16/2020  8:00 AM Augustin, Kristin E, AUD [facility] AUD   02/16/2020  8:35 AM Schmale, Isaac, MD ENT None

## 2020-02-11 ENCOUNTER — Encounter: Payer: Self-pay | Admitting: Primary Care

## 2020-02-15 MED ORDER — ROSUVASTATIN CALCIUM 20 MG PO TABS *I*
20.0000 mg | ORAL_TABLET | Freq: Every day | ORAL | 3 refills | Status: DC
Start: 2020-02-15 — End: 2021-05-03

## 2020-02-15 NOTE — Telephone Encounter (Signed)
Last Appointment: 12/31/2019  Next Appointment:   Future Appointments   Date Time Provider Department Center   02/16/2020  8:00 AM Elita Boone, AUD [facility]   02/16/2020  8:35 AM Milinda Hirschfeld, MD ENT None

## 2020-02-16 ENCOUNTER — Ambulatory Visit: Payer: PRIVATE HEALTH INSURANCE | Admitting: Otolaryngology

## 2020-02-16 ENCOUNTER — Encounter: Payer: Self-pay | Admitting: Otolaryngology

## 2020-02-16 ENCOUNTER — Ambulatory Visit: Payer: PRIVATE HEALTH INSURANCE | Attending: Primary Care | Admitting: Audiology

## 2020-02-16 VITALS — BP 119/77 | HR 82 | Temp 97.9°F | Ht 66.0 in | Wt 176.4 lb

## 2020-02-16 DIAGNOSIS — R448 Other symptoms and signs involving general sensations and perceptions: Secondary | ICD-10-CM

## 2020-02-16 DIAGNOSIS — H9190 Unspecified hearing loss, unspecified ear: Secondary | ICD-10-CM

## 2020-02-16 DIAGNOSIS — J309 Allergic rhinitis, unspecified: Secondary | ICD-10-CM

## 2020-02-16 NOTE — Progress Notes (Signed)
AUDIOLOGIC EVALUATION     [facility]   [address] [place], [address]  [place],  [place] [address]  Phone: [phone], Fax: [phone]     Outpatient Visit  Patient: Austin Rice   MR Number: [id number]   Date of Birth: 03/02/1975   Date of Visit: 02/16/2020      PURE-TONE TEST RESULTS  Type of Testing: conventional      Test Reliability: good  Transducer: insert,headphone  Booth: 1  ANSI S3.21.2004 (R2009)     Air Conduction Testing (dB HL and kHz)     LEFT EAR RIGHT EAR     0.125 0.25 0.50  0.75 1.0 1.5 2.0 3.0 4.0 6.0 8.0  0.125 0.25 0.50 0.75 1.0 1.5 2.0 3.0 4.0 6.0 8.0     15 15   15   15 25 25 25 20      15 10   10   15 15 20 10 10                                                                        Effective Masking Level                                                      Bone Conduction Testing (dB HL and kHz)  Bone conduction was unmasked/unspecified      0.25 0.50  0.75 1.0 1.5 2.0 3.0 4.0     0.25 0.50 0.75 1.0 1.5 2.0 3.0 4.0                          15 5   5   15   20                                                                          Effective Masking Level                                               SPEECH AUDIOMETRY     SAT SRT Score dB HL EML  Test  SAT SRT Score dB HL EML  Test       100 % 55 25 W-22 CD      100 % 55 25 W-22 CD   EML   EML            EML   EML               Please refer to scanned [facility] [id number] MR for additional information     Notes  Threshold in dB HL  Frequency in kiloHertz (kHz) Legend   dB=decibels  HL=Hearing Level  NR=No Response  VT=Vibro-Tactile EML=Effective Masking Level  SAT=Speech Awareness Threshold  SRT=Speech Reception  Threshold  MLV=Monitored Live Voice  CD=Compact Disk       HISTORY: Austin Rice was referred by Dr. Winifred Olive for an audiological evaluation.  Patient reports that he has had concerns for general congestion and sinus issues for many years.  He states that sometimes his ears feel plugged.  He feels that his ears and hearing  feel as though they are at baseline today.  He generally does not feel he has difficulty understanding speech, but does have occasional difficulty following streams of conversation in difficult listening environments when there are multiple speakers with background noise.  He denies tinnitus or dizziness.      FINDINGS: Pure-tone test results indicate normal to borderline normal hearing sensitivity, left ear, and normal hearing sensitivity, right ear.  Speech recognition ability in quiet was excellent, in both ears, when speech was presented at an average conversational level.    ACOUSTIC IMMITTANCE:  TYMPANOMETRY:  Right Ear: Tested Left Ear: Tested   Frequency: 226 Hz  Frequency: 226 Hz   Canal Volume (ml): 1.3 Canal Volume (ml): 1.4   Static Compliance Peak (ml): 0.6 Static Compliance Peak (ml): 0.7   Peak Pressure (daPa): -27 Peak Pressure (daPa): -34            These results indicate normal ear canal volume, peak pressure, and static compliance, consistent with normal Eustachian tube function, bilaterally.      Results were reviewed with the patient.  He is scheduled for evaluation with Dr. Winifred Olive to follow today's audiologic evaluation.      RECOMMENDATIONS: Audiological re-evaluation as per Dr. Winifred Olive.    Margaretmary Bayley. Lorelee Cover, AuD, Landscape architect  [facility]

## 2020-02-16 NOTE — Progress Notes (Signed)
Reason for Visit  Facial and nasal congestion follow up    HPI  Austin Rice is a 45 y.o. male who returns for FU. He has done remarkably well on Nasonex and allegra. He has essentially no symptoms today. His audiogram is normal. We reviewed his CT scan in detail. He does not desire further interventions at this point.     To review: who presents with longstanding history of nasal congestion as well as ear pressure/fullness when laying down.  Patient denies any symptoms while awake and up and about.  He describes a mostly right-sided sensation of ear plugging/fullness as well as nasal obstruction/congestion.  This happens frequently or most nights.  He denies any snoring or sleep apneas.  He denies any known history of teeth grinding or clenching.  He denies any frequent sinusitis or other sinonasal complaints.  He is never had nasal or sinus surgery.  Denies any history of asthma.  Patient does have a self diagnosed history of allergic rhinitis with a long family history of allergies.  He is never been formally tested.  He denies any allergy to aspirin or ibuprofen.  He denies any reaction alcohol.    Rhinology Scores 02/16/2020 11/19/2019   SNOT-22 Score 5 17   NSI Score 1 6   SCT Score 5 2        Nasal Symptom Inventory  Facial or sinus pressure:  None   Facial or sinus pain:  None   Headache:  None   Nasal congestion:  Mild   Nasal obstruction:  None   Post-nasal drip:  None   Clear nasal discharge:   None   Discolored nasal discharge:  None   Itchy nose/eyes/throat:  None   Nose bleeds: None   Tiredness:  None   Wheezing:  None   Cough:  None     Allergies  He is allergic to shellfish-derived products.    PMH  He has a past medical history of Arthritis, Asthma, Cerebral palsy, GERD (gastroesophageal reflux disease), and Hyperlipidemia.    PSH  He has a past surgical history that includes Kidney surgery and hip replacement (Right).    FH  His family history includes Asthma in his brother and mother; Diabetes  in his father; GERD in his mother; High Blood Pressure in his father; Hyperlipidemia in his father; Hypertension in his father.    SH  He reports that he quit smoking about 7 years ago. His smoking use included cigarettes. He has a 5.00 pack-year smoking history. He has never used smokeless tobacco. He reports current alcohol use. He reports previous drug use.     PE  Vitals BP 119/77    Pulse 82    Temp 36.6 C (97.9 F) (Temporal)    Ht 1.676 m (5\' 6" )    Wt 80 kg (176 lb 6.4 oz)    BMI 28.47 kg/m    General General appearance normal. Alert and cooperative. Voice normal.   Head/Face Normocephalic; atraumatic. No facial skin lesions. No maxillary tenderness. .   Eyes Extraocular muscles intact. No nystagmus with lateral gaze.   Ears, Nose, Mouth & Throat   External ears normal. External nose normal. Nasal mucosa normal. Septum relatively midline. Inferior turbinates normal.    Neck No masses.    Respiratory Normal respiratory effort. No retractions.   Cardiovascular Upper extremities well-perfused.         Results  Audiogram 02/16/2020: normal hearing and normal tymps.     CT sinus:  mild scattered mucosal disease bilaterally, worst in the maxillary sinus floor and posterior ethmoids. Left polypoid edema of middle turbinate. No polyps or other masses seen. Overall consistent with mild allergic inflammation.         Assessment  1. Allergic rhinitis  2. Ear pressure- resolved  3. Facial pressure- resolved  4. 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     Sincerely,    Marcello Moores L. Winifred Olive, MD  Assistant Professor  Rhinology and Anterior Skull Base Surgery  Department of Otolaryngology Head and Neck Surgery  [facility]

## 2020-06-09 ENCOUNTER — Encounter: Payer: Self-pay | Admitting: Family

## 2020-06-09 ENCOUNTER — Ambulatory Visit
Admission: RE | Admit: 2020-06-09 | Discharge: 2020-06-09 | Disposition: A | Discharge status: Home / Self Care | Payer: PRIVATE HEALTH INSURANCE | Source: Ambulatory Visit | Admitting: Radiology

## 2020-06-09 ENCOUNTER — Ambulatory Visit: Payer: PRIVATE HEALTH INSURANCE | Admitting: Family

## 2020-06-09 VITALS — BP 121/78 | HR 90 | Ht 66.0 in | Wt 176.0 lb

## 2020-06-09 DIAGNOSIS — M1712 Unilateral primary osteoarthritis, left knee: Secondary | ICD-10-CM

## 2020-06-09 DIAGNOSIS — M25562 Pain in left knee: Secondary | ICD-10-CM

## 2020-06-09 DIAGNOSIS — M25561 Pain in right knee: Secondary | ICD-10-CM

## 2020-06-09 NOTE — Progress Notes (Signed)
Chief Complaint   Patient presents with    Left Knee - New Patient Visit         HISTORY OF PRESENT ILLNESS    Austin Rice is a 45 y.o. male  Referred by Marden Noble  for evaluation and treatment of a left knee    DOI: December 2021  Mechanism of injury: No known injury/trauma. Reports hx of CP.  Current symptoms include: constant ache   The pain is located: left knee, anterior/,medial knee     Symptoms improve with: ice  The symptoms are worse with: weight bearing, pivoting   Mechanical symptoms: popping/clicking  Instability: at times    Previous injuries: none to knee  Previous surgeries: hx right THA 2016    Treatment to date:  [x]  Icing  []  Elevation  []  Rest  []  Heat  [x]  Anti-inflammatories:   []  Tylenol  []  Brace  []  Stretching/home exercises  []  PT  []  Injection  []  Chiropractic  []  Massage therapy  []  Acupuncture    [x]  Other:crutch     Past Medical History:   Diagnosis Date    Arthritis     Asthma     Cerebral palsy     GERD (gastroesophageal reflux disease)     Hyperlipidemia        Current Outpatient Medications   Medication    rosuvastatin (CRESTOR) 20 mg tablet    chlorhexidine (PERIDEX) 0.12 % solution    mometasone (NASONEX) 50 MCG/ACT nasal spray    fexofenadine (ALLEGRA) 180 MG tablet    omeprazole (PRILOSEC) 20 MG capsule    albuterol HFA (PROVENTIL, VENTOLIN, PROAIR HFA) 108 (90 Base) MCG/ACT inhaler    ondansetron (ZOFRAN-ODT) 4 MG disintegrating tablet     No current facility-administered medications for this visit.       Past Surgical History:   Procedure Laterality Date    HIP REPLACEMENT Right     KIDNEY SURGERY         Family History   Problem Relation Age of Onset    Asthma Mother     GERD Mother     Hyperlipidemia Father     Hypertension Father     Diabetes Father     High Blood Pressure Father     Asthma Brother        Social History     Tobacco Use    Smoking status: Former Smoker     Packs/day: 0.25     Years: 20.00     Pack years: 5.00     Types: Cigarettes      Quit date: 2015     Years since quitting: 7.3    Smokeless tobacco: Never Used   Substance Use Topics    Alcohol use: Yes     Comment: monthly       Social History     Socioeconomic History    Marital status: Married     Spouse name: Not on file    Number of children: Not on file    Years of education: Not on file    Highest education level: Not on file   Occupational History    Not on file   Tobacco Use    Smoking status: Former Smoker     Packs/day: 0.25     Years: 20.00     Pack years: 5.00     Types: Cigarettes     Quit date: 2015     Years since quitting: 7.3    Smokeless  tobacco: Never Used   Substance and Sexual Activity    Alcohol use: Yes     Comment: monthly    Drug use: Not Currently    Sexual activity: Yes     Partners: Female     Birth control/protection: None   Social History Narrative    Not on file         Occupation: Systems analyst   Level of activity at baseline: bowling   Obstacles to activity: pain  Smoker: denies    ROS:  positive for none, except as listed in this note, all other systems were reviewed and documented as per the patient intake form      OBJECTIVE:   Vital signs:    Vitals:    06/09/20 1412   BP: 121/78   Pulse: 90   Weight: 79.8 kg (176 lb)   Height: 1.676 m (5\' 6" )     Pain score:   Pain    06/09/20 1412   PainSc:   5   PainLoc: Knee     GEN: Well developed, well nourished, no acute distress.   HEENT: EOMI, PERRL, no scleral icterus, no oral lesions  NECK: no masses, no signs of meningimus  SKIN: no rash, no open lesions  LYMPH: no LAD  PSYCH:  Alert and oriented x3, pleasant and cooperative with exam  EXTR: no swelling, erythema ecchymoses or edema  MSK: left knee: no erythema or ecchymosis. TTP inferior patella, medial joint line. No overt opening with valgus/varus stress. Lachman's with firm endpoint. +McMurray's. 4+/5 KF/KE.  NEURO: Sensation intact   Gait: antalgic    IMAGING: I reviewed the imaging studies of the left knee x-ray and my interpretation of  them is as follows: possible avulsion fx inferior patella. Mod degenerative changes medial/lateral joint line.       ICD-10-CM ICD-9-CM   1. Left knee pain  M25.562 719.46       ASSESSMENT AND PLAN: 45 y.o male with left knee pain,  -MRI of left knee ordered  -Reddie wrap knee brace provided  -Recommend RICE measures   -Patient was advised in the appropriate dosing of acetaminophen and anti-inflammatories and advised of GI side effects to monitor for  -Patient was advised in the appropriate ice/heat symptomatic management   Follow up for after MRI.    Orders Placed This Encounter   Procedures    Knee 3 view LEFT 1 view RIGHT    MR knee LEFT without contrast       1. Left knee pain  - Knee 3 view LEFT 1 view RIGHT; Future  - MR knee LEFT without contrast; Future

## 2020-07-07 ENCOUNTER — Ambulatory Visit
Admission: RE | Admit: 2020-07-07 | Discharge: 2020-07-07 | Disposition: A | Discharge status: Home / Self Care | Payer: PRIVATE HEALTH INSURANCE | Source: Ambulatory Visit | Attending: Family | Admitting: Family

## 2020-07-07 DIAGNOSIS — S86812A Strain of other muscle(s) and tendon(s) at lower leg level, left leg, initial encounter: Secondary | ICD-10-CM

## 2020-07-07 DIAGNOSIS — M25562 Pain in left knee: Secondary | ICD-10-CM

## 2020-07-12 ENCOUNTER — Encounter: Payer: Self-pay | Admitting: Family

## 2020-07-12 ENCOUNTER — Ambulatory Visit: Payer: PRIVATE HEALTH INSURANCE | Admitting: Family

## 2020-07-12 VITALS — BP 147/85 | HR 92 | Ht 66.0 in | Wt 177.0 lb

## 2020-07-12 DIAGNOSIS — M25562 Pain in left knee: Secondary | ICD-10-CM

## 2020-07-12 NOTE — Progress Notes (Signed)
CC: left knee pain        HPI: 45 y.o male who returns to clinic for follow up of left knee pain. Reports increased pain since previous visit with no new injury/trauma. Pain is described as constant ache, anterior knee. Pain increases when ambulating. Reports swelling/warmth at times. Denies any redness. Reports popping/clicking, and instability at times. Denies any locking. He is applying ice, wearing knee brace and taking Aleve nightly for pain. Denies any new injury/trauma. He presents today to review MRI of the left knee results.  New injury: no  Problem improved?: increased pain  Did PT: no  Has been helped by: ice, Aleve     Vitals:    07/12/20 1552   BP: 147/85   Pulse: 92   Weight: 80.3 kg (177 lb)   Height: 1.676 m (5\' 6" )     Pain    07/12/20 1552   PainSc:   6   PainLoc: Knee         PE:  PERRL, EOMI  Alert, able to answer questions appropriately  No distress with breathing, able to finish sentences  Cap refill < 3 seconds in distal extremities  No skin breakdown, no warmth, erythema  Normal sensation distally   Left knee: no erythema, edema or ecchymosis. TTP inferior patella. 4+/5 KF/KE. Sensation intact.     IMAGING: I reviewed the imaging studies of the Left knee MRI and my interpretation of them is as follows: Patella alta with chronic appearing partial tear of the proximal patellar tendon associated with inferior patellar avulsed osseous fragments. Moderate chondromalacia patella as well as signs of chronic patellofemoral maltracking. No evidence of meniscus injury or other acute findings.      ICD-10-CM ICD-9-CM   1. Left knee pain  M25.562 719.46       ASSESSMENT AND PLAN: 45 y.o male with left knee pain,  -Recommend referral to PT, deferred  -Offered diagnostic cortisone injection, per patient he would like this 54 guided  -Will plan for follow up with Dr. [place] for evaluation of left knee  -Discussed referral to joint surgeon for possible TKA, patient will message the office who he would like  to see  -Patient was advised in the appropriate dosing of acetaminophen and anti-inflammatories and advised of GI side effects to monitor for  -Patient was advised in the appropriate ice/heat symptomatic management   -Follow up for Next available appointment with Dr. Clydene Fake.    No orders of the defined types were placed in this encounter.

## 2020-08-28 ENCOUNTER — Ambulatory Visit: Payer: PRIVATE HEALTH INSURANCE | Admitting: Orthopedic Surgery

## 2020-08-28 ENCOUNTER — Encounter: Payer: Self-pay | Admitting: Orthopedic Surgery

## 2020-08-28 VITALS — BP 120/65 | HR 83 | Ht 66.0 in | Wt 176.0 lb

## 2020-08-28 DIAGNOSIS — G8929 Other chronic pain: Secondary | ICD-10-CM

## 2020-08-28 DIAGNOSIS — M25562 Pain in left knee: Secondary | ICD-10-CM

## 2020-08-28 MED ORDER — LIDOCAINE HCL 1% IJ SOLN (PF) (AMB) *I*
1.0000 mL | Freq: Once | INTRAMUSCULAR | Status: AC | PRN
Start: 2020-08-28 — End: 2020-08-28
  Administered 2020-08-28: 1 mL

## 2020-08-28 MED ORDER — METHYLPREDNISOLONE ACETATE 40 MG/ML IJ SUSP *I*
40.0000 mg | Freq: Once | INTRAMUSCULAR | Status: AC | PRN
Start: 2020-08-28 — End: 2020-08-28
  Administered 2020-08-28: 40 mg

## 2020-08-28 NOTE — Progress Notes (Signed)
CC: left knee pain        HPI:  New injury: no  Has not had IA injection        Vitals:    08/28/20 1532   BP: 120/65   Pulse: 83   Weight: 79.8 kg (176 lb)   Height: 1.676 m (5\' 6" )     Pain    08/28/20 1532   PainSc:   6   PainLoc: Knee         PE:  PERRL, EOMI  Alert, able to answer questions appropriately  No distress with breathing, able to finish sentences  Cap refill < 3 seconds in distal extremities  No skin breakdown, no warmth, erythema  Normal sensation distally   Pain with palpation over proximal patellar tendon, mild swelling  No pain over medial or lateral joint lines, no pain over patellar facets    IMAGING: I reviewed the imaging studies of the MRI of the knee and my interpretation of them is as follows: calcification at proximal patellar tendon, has bony edema in patella and femur from patellar femoral chondral defect      ICD-10-CM ICD-9-CM   1. Chronic pain of left knee  M25.562 719.46    G89.29 338.29       ASSESSMENT AND PLAN: knee pain, chronic. Has mild CP on the right side, may add to increased impact on the left knee. There are not great surgical options for this but where he is having the majority of his pain. SOme pain could also be from PF joint. Will start with infrapatellar nerve sheath injection to see how much is chronic nerve pain. Consider IA injection if this nerve sheath injeciton is not helpful  -Patient was advised in the appropriate dosing of acetaminophen and anti-inflammatories and advised of GI side effects to monitor for  -Patient was advised in the appropriate ice/heat symptomatic management   -Follow up for Pt will call to update impact of injection on pain.    Orders Placed This Encounter   Procedures    Spine - Pain Tx     Answers for HPI/ROS submitted by the patient on 08/27/2020  What is your goals for today's visit?: Cortizone shot  Date of onset: : 01/29/2020  Was this the result of an injury?: No  What is your pain level?: 7/10  Please describe the quality of your  pain: : aching, shooting  What diagnostic workup have you had for this condition?: MRI scan, X-ray  What treatments have you tried for this condition?: bracing, crutches, ice  Progression since onset: : gradually worsening  Is this a work related condition? : No  Current work status: : usual activities  Fever: No  Chills: No  Numbness: No  Tingling: No

## 2020-08-28 NOTE — Procedures (Signed)
Date/Time: 08/28/2020  3:30 PM EDT  Nerve and Sympathetic Blocks: Other Peripheral Nerve Block - CPT [id number]  Imaging guidance: Ultrasound guidance - CPT [phone]  Laterality: Left  Anesthetic medication - Left:  1 mL Lidocaine HCl (PF) 1 %  Steroid medication - Left: 40 mg methylPREDNISolone acetate 40 MG/ML    Infrapatellar nerve identified and targeted with injectant

## 2020-08-28 NOTE — Patient Instructions (Signed)
Injection Instructions    What to Anticipate From your injection:  · The injection you received should provide symptom improvement within 5-7 days, but may take anywhere from 10-14 days to provide complete results.  · Persistent or worse pain during this time is common as the medication is being absorbed.    Care of Injection Site:  · Ice may be applied to injection site to ease local discomfort  · Apply ice for 15 minutes, remove for an hour, and repeat as needed, always cover ice with a cloth/towel  · Bandaids may be removed the same day or once bleeding has stopped    Activity:  · You may continue your usual activities    Medications:  · You may take over the counter pain medications, such as Tylenol, Advil/Motrin, or Aleve if allowed by primary care doctor (follow instructions on the bottle)    Note to Diabetic Patients:  · Cortisone may increase blood sugar level for several days to 2 weeks  · Monitor glucose levels closely    Remember:  · Most injections are safe, effective, and without any significant issues    Questions/Concerns:  · During business hours you may call the office of the provider who gave you the injection concerning your questions.  You may also talk with the Nurse Practitioner or Physicians Assistant    Notify the Office or Answering Service if you Experience Any of the Following:   · Fever greater than 100 degrees  · Redness  · Swelling  · Hives  · Rash  · Answering service number [phone], ask for Physician on call    **For Dr. Rizzone's patients, if you have urgent matters call the office during the day, if after hours go to the Emergency Department

## 2021-02-26 ENCOUNTER — Ambulatory Visit: Payer: PRIVATE HEALTH INSURANCE | Attending: General Orthopedics | Admitting: General Orthopedics

## 2021-02-26 VITALS — BP 139/80 | HR 78 | Temp 97.0°F | Resp 18

## 2021-02-26 DIAGNOSIS — S060X0A Concussion without loss of consciousness, initial encounter: Secondary | ICD-10-CM | POA: Insufficient documentation

## 2021-02-26 DIAGNOSIS — W19XXXA Unspecified fall, initial encounter: Secondary | ICD-10-CM | POA: Insufficient documentation

## 2021-02-26 DIAGNOSIS — S0081XA Abrasion of other part of head, initial encounter: Secondary | ICD-10-CM | POA: Insufficient documentation

## 2021-02-26 MED ORDER — TETANUS-DIPHTH-ACELL PERT 5-2.5-18.5 LF-MCG/0.5 IM SUSP *WRAPPED*
0.5000 mL | Freq: Once | INTRAMUSCULAR | Status: AC
Start: 2021-02-26 — End: 2021-02-26
  Administered 2021-02-26: 0.5 mL via INTRAMUSCULAR
  Filled 2021-02-26: qty 0.5

## 2021-02-26 NOTE — UC Provider Note (Signed)
History     Chief Complaint   Patient presents with    Head Injury     Patient tripped and fell 4 days ago, abrasions to right side of patient's head, "my ears keep popping", "I feel a little off, not really dizzy" no cognitive deficits per patient     46 y/o M with Cerebral palsay, GERD, Asthma, HLD, allergic rhinitis, now presenting to UC with cc of fall and head injury. Patient [place] the airport [place] he fell after he luggage caught on something. He fell from a standing position and struck the right side of his face. He sustained an abrasion. He reports "feeling off" or "feeling foggy."  He had a headache at the time, but this resolved. He did not lose consciousness. He was not evaluated before today. Patient also reports popping in the ears. He last had a tetanus shot in 2013 prior to a trip. He denies other injuries. He has a primary care physician.      History provided by:  Patient  Head Injury      Medical/Surgical/Family History     Past Medical History:   Diagnosis Date    Arthritis     Asthma     Cerebral palsy     GERD (gastroesophageal reflux disease)     Hyperlipidemia         Patient Active Problem List   Diagnosis Code    BMI 28.0-28.9,adult Z68.28    Hyperlipidemia, unspecified hyperlipidemia type E78.5    Sinus pain J34.89    Allergic rhinitis J30.9    Prediabetes R73.03            Past Surgical History:   Procedure Laterality Date    HIP REPLACEMENT Right     KIDNEY SURGERY       Family History   Problem Relation Age of Onset    Asthma Mother     GERD Mother     Hyperlipidemia Father     Hypertension Father     Diabetes Father     High Blood Pressure Father     Asthma Brother           Social History     Tobacco Use    Smoking status: Former     Packs/day: 0.25     Years: 20.00     Pack years: 5.00     Types: Cigarettes     Quit date: 2015     Years since quitting: 8.0    Smokeless tobacco: Never   Substance Use Topics    Alcohol use: Yes     Comment:  monthly    Drug use: Not Currently     Living Situation     Questions Responses    Patient lives with     Homeless     Caregiver for other family member     External Services     Employment     Domestic Violence Risk                 Review of Systems   Review of Systems   Skin: Positive for wound.   Neurological: Positive for light-headedness.       Physical Exam   Vitals      First Recorded BP: 139/80, Resp: 18, Temp: 36.1 C (97 F) Oxygen Therapy SpO2: 97 %, Heart Rate: 78, (02/26/21 1021)  .      Physical Exam  Vitals reviewed.  Constitutional:       Appearance: Normal appearance. He is not ill-appearing, toxic-appearing or diaphoretic.   HENT:      Head: Normocephalic.      Jaw: There is normal jaw occlusion.        Right Ear: There is impacted cerumen.      Left Ear: Tympanic membrane normal.      Mouth/Throat:      Lips: Pink.      Mouth: Mucous membranes are moist.      Pharynx: Oropharynx is clear. Uvula midline.   Eyes:      General: Gaze aligned appropriately.      Extraocular Movements: Extraocular movements intact.      Conjunctiva/sclera: Conjunctivae normal.      Pupils: Pupils are equal, round, and reactive to light.      Visual Fields: Right eye visual fields normal and left eye visual fields normal.   Cardiovascular:      Heart sounds: Normal heart sounds.   Pulmonary:      Effort: Pulmonary effort is normal.      Breath sounds: Normal breath sounds.   Skin:     General: Skin is warm and dry.      Capillary Refill: Capillary refill takes less than 2 seconds.   Neurological:      General: No focal deficit present.      Mental Status: He is alert.      GCS: GCS eye subscore is 4. GCS verbal subscore is 5. GCS motor subscore is 6.      Cranial Nerves: Cranial nerves 2-12 are intact.      Sensory: Sensation is intact.      Motor: Motor function is intact.   Psychiatric:         Mood and Affect: Mood normal.         Behavior: Behavior is cooperative.          Medical Decision Making   Medical Decision  Making  Assessment:    46 y/o M with fall 4 days ago and feeling foggy.    Differential diagnosis:    Fall  Concussion  Head injury  Abrasion of face  Cerumen impaction  Need for Tdap    Plan and Results:    We discussed close monitoring of symptoms and go to the emergency room if worsening or development of headache, nausea, vomiting, confusion, sensitivity to light.  I offered a referral to Neurology; patient would like to follow up with his primary care physician.  Tdap ordered  Patient agreeable to plan.  Encounter orders    Orders Placed This Encounter      Tdap (BOOSTRIX): tetanus, diphtheria, and accellar pertussis injection 0.5 mL        Independent Review of: chart/prior records    Diagnosis and Disposition:     ICD-10-CM ICD-9-CM  1. Fall, initial encounter  W19.XXXA E888.9  2. Concussion without loss of consciousness, initial encounter  S06.0X0A 850.0  3. Abrasion of face, initial encounter  S00.81XA 910.0  Follow-up  Follow up if symptoms worsen or fail to improve, for PCP Evaluation.            Final Diagnosis    ICD-10-CM ICD-9-CM   1. Fall, initial encounter  W19.XXXA E888.9   2. Concussion without loss of consciousness, initial encounter  S06.0X0A 850.0   3. Abrasion of face, initial encounter  S00.81XA 910.0         Earney Navy, [place]

## 2021-02-26 NOTE — Patient Instructions (Signed)
Please follow up with your primary care physician  Seek care if headache, nausea, vomiting, sensitivity to light, confusion/go to the emergency room

## 2021-02-27 ENCOUNTER — Telehealth: Payer: Self-pay | Admitting: Primary Care

## 2021-02-27 NOTE — Telephone Encounter (Signed)
Wife Nira Conn calling- Patient fell on Friday morning and went to Urgent care. He has since been experiencing fogginess. Urgent Care recommended a referral for Neurology. He initially declined but since traveling for work this occurs more frequently along with the falls he thinks he should see a specialist.     Nira Conn would like a return call to advise.

## 2021-02-27 NOTE — Telephone Encounter (Signed)
Call returned to wife with patient in background.    Patient had recent fall with exam done at urgent Care.     Requesting Neurology referral for mental fog.    Office visit scheduled with NP for 02/28/2021 for further evaluation.

## 2021-02-28 ENCOUNTER — Other Ambulatory Visit: Payer: Self-pay

## 2021-02-28 ENCOUNTER — Encounter: Payer: Self-pay | Admitting: Adult Health

## 2021-02-28 ENCOUNTER — Ambulatory Visit: Payer: PRIVATE HEALTH INSURANCE | Admitting: Adult Health

## 2021-02-28 VITALS — BP 128/78 | HR 92 | Temp 96.7°F | Ht 66.0 in | Wt 164.0 lb

## 2021-02-28 DIAGNOSIS — R4189 Other symptoms and signs involving cognitive functions and awareness: Secondary | ICD-10-CM

## 2021-02-28 DIAGNOSIS — W19XXXA Unspecified fall, initial encounter: Secondary | ICD-10-CM

## 2021-02-28 DIAGNOSIS — S060XAA Concussion with loss of consciousness status unknown, initial encounter: Secondary | ICD-10-CM

## 2021-02-28 NOTE — Patient Instructions (Signed)
Keep monitoring for any new changes and notify us.   Call for you neuro appointment and if the appointment is too far ahead contact me back to discuss

## 2021-02-28 NOTE — Progress Notes (Signed)
Patient ID: Austin Rice is a 46 y.o. year old male.    Chief Complaint   Patient presents with    Fall    brain fog     HPI:  Fall:  -This happened Friday morning (5 days ago). He reports walking with his crutch (patient has cerebral palsy and uses a crutch permanently) and suitcase, he caught suitcase on something, and he fell on his face as his hands were both busy.    -He reports he fell flat on his face and he hit the right side of his forehead and cheek.  He had some small warts that since have scabbed and are healing well.   -He denies any loss of consciousness or any other previous incidences of falls.    Brain fog:  -This started immediately after the fall.  He reports "almost like being dizzy but not quite" "it does not feel right".  He reports it is very difficult for him to explain his symptoms.    -He denies any change in strength or balance, any changes in vision, hearing.  -Patient reported that he feels like he is processing things slower than his baseline.  For instance he states that when his wife asks him a question he thinks he is answering normally and she asks him why he is not answering and why is he keeping quiet.  -Not reporting any headaches.  -He was seen on Monday by urgent care and they told him that likely he has a concussion.  They offered to give him a referral for neuro but he wanted to discuss it with [place] first as he was hoping that they bogginess would improve..    Medications were reviewed and reconciled with the patient in eRecord today; and No changes were made.    PMHx:    Past Medical History:   Diagnosis Date    Arthritis     Asthma     Cerebral palsy     GERD (gastroesophageal reflux disease)     Hyperlipidemia        ROS:  As reported in HPI      Allergy / Social History / Medications:  Allergies   Allergen Reactions    Shellfish-Derived Products Itching     Scratchy throat to shrimp     Social History     Tobacco Use    Smoking status: Former     Packs/day:  0.25     Years: 20.00     Pack years: 5.00     Types: Cigarettes     Quit date: 2015     Years since quitting: 8.0    Smokeless tobacco: Never   Substance Use Topics    Alcohol use: Yes     Comment: monthly     Patient's Medications   New Prescriptions    No medications on file   Previous Medications    ALBUTEROL HFA (PROVENTIL, VENTOLIN, PROAIR HFA) 108 (90 BASE) MCG/ACT INHALER    Inhale 1-2 puffs into the lungs every 6 hours as needed  Shake well before each use.    FEXOFENADINE (ALLEGRA) 180 MG TABLET    Take 1 tablet (180 mg total) by mouth daily    MOMETASONE (NASONEX) 50 MCG/ACT NASAL SPRAY    Spray 2 sprays into each nostril daily    OMEPRAZOLE (PRILOSEC) 20 MG CAPSULE    Take 20 mg by mouth daily (before breakfast)    ONDANSETRON (ZOFRAN-ODT) 4 MG DISINTEGRATING TABLET  Take 1 tablet (4 mg total) by mouth 3 times daily as needed for Vomiting for up to 10 doses Place on top of tongue.    ROSUVASTATIN (CRESTOR) 20 MG TABLET    Take 1 tablet (20 mg total) by mouth daily   Modified Medications    No medications on file   Discontinued Medications    No medications on file          Physical Exam:  Vitals:    02/28/21 1300   BP: 128/78   Pulse: 92   Temp: 35.9 C (96.7 F)   Weight: 74.4 kg (164 lb)   Height: 1.676 m (5\' 6" )       Estimated body mass index is 26.47 kg/m as calculated from the following:    Height as of this encounter: 1.676 m (5\' 6" ).    Weight as of this encounter: 74.4 kg (164 lb).  OBJECTIVE:  GENERAL: Alert, in no apparent distress, good attention, well groomed.  Normal Habitus.  SKIN: Some healing superficial wounds with some small scabs in the right facial area.  EYES: Anicteric, no exophthalmos.  Lids not drooping.  Sclera normal.  CHEST: Clear to auscultation bilaterally with good air movement; and no wheezes or rales.  No accessory muscle usage.  Respirations unlabored.  Chest wall movement symmetric.  CARDIAC: Regular without gallops or rubs;  no heaves, no thrills;  no murmurs.    NEURAL EXAM: MENTAL STATUS:   Alert and oriented. Pleasant and cooperative with the exam. Speech fluent and coherent. Mood and affect congruent. Fund of knowledge appropriate for the conversation.  Good attention to conversation.  Remote as well as recent memory appear intact.  NEUROLOGIC:   Extra ocular movements full, PERRL, No nystagmus, No facial droop, acuity to conversational tones good, able to raise eyebrows symmetrically, able to close both eye symmetrically.  Facial sensation is intact to light touch. hearing intact to scratch test.  normal neck rotator muscle tone.   5/5 bicep.  5/5 tricep.  5/5 grip.  5/5 digit #1&5 opposition, 5/5 digit #2&5 abduction.  5/5 digit #4&5 adduction.  5/5 wrist DF, 5/5 wrist PF. RAM finger to nose tests without ataxia.  No resting or active tremor. No muscle fasciculations appreciated. Romberg negative. No pronator drift.  Gait is uneven due to different length of legs with the help of a crutch.Patient rises out of a chair with a fluid movement & walks with a normal stride to the examining table.    Recent Lab Results:  Lab Results   Component Value Date    NA 137 12/31/2019    K 4.2 12/31/2019    CL 102 12/31/2019    CO2 21 12/31/2019    UN 15 12/31/2019    CREAT 1.04 12/31/2019    CHOL 179 12/31/2019    TRIG 141 12/31/2019    HDL 47 12/31/2019    LDLC 104 12/31/2019    CHHDC 3.8 12/31/2019     Hemoglobin A1C   Date Value Ref Range Status   12/31/2019 5.7 (H) % Final     Comment:     Ref Range <=5.6  HbA1c values of 5.7-6.4% indicate an increased risk for developing  diabetes mellitus.  HbA1c values greater than or equal to 6.5% are diagnostic of  diabetes mellitus.  For diagnosis of diabetes in individuals without unequivocal  hyperglycemia, results should be confirmed by repeat testing.            Assessment and Plan:  1. Brain fog  [facility] REFERR[facility]      2. Fall  AMB REFERRAL [facility]      3. Concussion  AMB REFERRAL  [facility]      -Discussed with patient that there is a likelihood that his symptoms are those of a concussion.  He would like a referral to neurology as he feels the symptoms are debilitating, and have not improved.  -He was given referral to neurology.  Patient will notify provider if appointment is much further down the line than acceptable especially if his symptoms have not improved.  At that time we are going to consider doing outside referral.    Orders this visit  Orders Placed This Encounter   Procedures    AMB REFERRAL TO [facility]

## 2021-03-08 ENCOUNTER — Other Ambulatory Visit: Payer: Self-pay

## 2021-03-08 ENCOUNTER — Ambulatory Visit: Payer: PRIVATE HEALTH INSURANCE | Admitting: Neurology

## 2021-03-08 ENCOUNTER — Encounter: Payer: Self-pay | Admitting: Neurology

## 2021-03-08 VITALS — BP 127/70 | HR 70 | Temp 97.9°F

## 2021-03-08 DIAGNOSIS — S0990XA Unspecified injury of head, initial encounter: Secondary | ICD-10-CM

## 2021-03-08 DIAGNOSIS — G809 Cerebral palsy, unspecified: Secondary | ICD-10-CM

## 2021-03-08 NOTE — Progress Notes (Signed)
Dear Dr. Lorie Apley:    Thank you for referring Austin Rice for evaluation of "brain fog" following a head injury.    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  He was carrying his suitcase in one arm and using his crutch in the other and when the suitcase got caught he lost his balance, hitting the right side of his head and sustaining abrasions to his right upper and lower limbs.  He did not lose consciousness and had no amnesia prior to or following the event.  He experienced a headache for the next 24 hours but this resolved.  However, since that time, he states that his head "does not feel right".  He has difficulty describing the sensation.  It is not dizziness or vertigo but more of a lightheaded sensation.  His wife notes that it takes him slightly longer to process information when she speaks to him.  Overall, he feels that this foggy sensation has improved.  He was seen in urgent care on January 30 and was told that he had a mild concussion.  Because his symptoms have persisted, he is now referred for neurologic evaluation.    He recalls having a concussion in 1995 when he hit his head on an overhanging cabinet.  He did lose consciousness with that episode but all symptoms resolved within several days.    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    Habits: He drinks alcohol occasionally.  He does not smoke cigarettes.    Allergies to medications: None.    Family history notable for diabetes mellitus and  heart disease    Review of systems otherwise negative.    Physical examination: Pulse 70 bpm, blood pressure 127/70 mmHg.  He appeared well.  He had hemiatrophy of his right limbs.  Lungs are clear and heart tones are normal.  There were abrasions on his right knee, right elbow.    Neurologic examination: Mental status: Alert and oriented.  He easily recalled 3 objects at 5 minutes without clues.  He could name the current and recent presidents, perform serial sevens to 65 and spell the word world backwards.  His language was fluent with intact naming, comprehension, repetition, reading and writing.  Figure copying was intact as was the clock draw.  Cranial nerves: Visual fields full.  Fundi with flat discs.  Pupils 4 mm constricting to 3 mm.  Ocular motions were full and conjugate.  Facial strength and sensation are normal.  Tongue and palate are in the midline.  On motor testing he had hemiatrophy of his right upper and lower limbs with slight weakness of the right lower limb.  Muscle tone was normal.  There was no pronator drift.  On sensory testing, he reported hyperpathia over the sole of his right foot.  Vibratory perception was decreased at the right great toe.  There was no ataxia with finger-to-nose or heel-to-shin testing.  Muscle stretch reflexes were trace in the upper limbs and at the knees and 1+  at the ankles.  The right plantar response is extensor and the left is flexor.  Romberg negative.  He had an asymmetric gait due to his right lower limb hemiatrophy.    Impression:   Head injury with persistent fogginess in the setting of a completely normal mental status examination   Cerebral palsy with right hemiatrophy    I do not believe that he suffered a concussion, since he did not lose consciousness and has no amnesia for the event.  I reassured him that his mental status examination is completely normal.  I mentioned that I expect his mental fogginess to resolve completely over the next week.  I  did not recommend any further neurologic testing or neuroimaging.    Thank you for referring Austin Rice to me.  I did not schedule a follow-up appointment for him but would be happy to see him again as needed.    Sincerely,  Lidia Collum, MD  Professor of neurology and medicine

## 2021-04-16 ENCOUNTER — Telehealth: Payer: Self-pay | Admitting: Primary Care

## 2021-04-16 NOTE — Telephone Encounter (Signed)
Patient dropped off Handicap parking permit form to be filled out by PCP. Placed in Provider's bin up front to be delivered.     Patient would like phone call to pick up when complete.

## 2021-04-18 ENCOUNTER — Encounter: Payer: Self-pay | Admitting: Gastroenterology

## 2021-04-18 NOTE — Telephone Encounter (Signed)
Form completed, placed in my outgoing mailbox. Please make copy into patient Erecord and notify patient for pickup.

## 2021-04-19 ENCOUNTER — Telehealth: Payer: Self-pay | Admitting: Primary Care

## 2021-04-19 ENCOUNTER — Encounter: Payer: Self-pay | Admitting: Gastroenterology

## 2021-04-19 NOTE — Telephone Encounter (Signed)
Patient notified form is ready to be picked up.     Copy was sent to scanning.

## 2021-04-19 NOTE — Telephone Encounter (Signed)
Received a call from patient. Patient states that he went to [facility] to get handicap license plates and handicap parking pass (for when he is in spouses car) and was informed that he needs another form filled out and brought to town hall.    Placed parking pass form in providers bin for completion.     Patient will come to office to pick up once completed.

## 2021-04-19 NOTE — Telephone Encounter (Signed)
Form completed, placed in my outgoing mailbox. Please make copy into patient Erecord and notify patient for pickup.

## 2021-04-20 NOTE — Telephone Encounter (Signed)
Patient notified form is ready for pick up. °

## 2021-05-03 ENCOUNTER — Encounter: Payer: Self-pay | Admitting: Primary Care

## 2021-05-03 ENCOUNTER — Other Ambulatory Visit: Payer: Self-pay

## 2021-05-03 ENCOUNTER — Ambulatory Visit: Payer: PRIVATE HEALTH INSURANCE | Admitting: Primary Care

## 2021-05-03 VITALS — BP 118/70 | HR 80 | Temp 97.1°F | Ht 66.0 in | Wt 161.0 lb

## 2021-05-03 DIAGNOSIS — Z0000 Encounter for general adult medical examination without abnormal findings: Secondary | ICD-10-CM

## 2021-05-03 DIAGNOSIS — Z1159 Encounter for screening for other viral diseases: Secondary | ICD-10-CM

## 2021-05-03 DIAGNOSIS — R7303 Prediabetes: Secondary | ICD-10-CM

## 2021-05-03 DIAGNOSIS — E785 Hyperlipidemia, unspecified: Secondary | ICD-10-CM

## 2021-05-03 DIAGNOSIS — J309 Allergic rhinitis, unspecified: Secondary | ICD-10-CM

## 2021-05-03 DIAGNOSIS — K219 Gastro-esophageal reflux disease without esophagitis: Secondary | ICD-10-CM

## 2021-05-03 DIAGNOSIS — Z114 Encounter for screening for human immunodeficiency virus [HIV]: Secondary | ICD-10-CM

## 2021-05-03 DIAGNOSIS — G809 Cerebral palsy, unspecified: Secondary | ICD-10-CM | POA: Insufficient documentation

## 2021-05-03 DIAGNOSIS — Z1211 Encounter for screening for malignant neoplasm of colon: Secondary | ICD-10-CM

## 2021-05-03 MED ORDER — FAMOTIDINE 20 MG PO TABS *I*
20.0000 mg | ORAL_TABLET | Freq: Two times a day (BID) | ORAL | 3 refills | Status: AC | PRN
Start: 2021-05-03 — End: ?

## 2021-05-03 MED ORDER — ROSUVASTATIN CALCIUM 20 MG PO TABS *I*
20.0000 mg | ORAL_TABLET | Freq: Every day | ORAL | 3 refills | Status: DC
Start: 2021-05-03 — End: 2022-07-23

## 2021-05-03 NOTE — Progress Notes (Signed)
History and Physical    Chief Complaint   Patient presents with   . Chief Complaint   Patient presents with   . Annual Exam        History of Present Illness:   HPI:  Austin Rice is a 46 y.o. male with PMH notable for hyperlipidemia and prediabetes who presents today for annual exam.    Hyperlipidemia: He continues on rosuvastatin 20 mg daily.    Allergic rhinitis:  on allergra and nasonex.  Had evaluation with ENT.    GERD: Continues omeprazole 20 mg daily that he is getting over-the-counter.    Health Maintenance:  - Diet: tries to choose healthy foods, well rounded diet.   - Exercise: at least once per week does a walk.   - Vision exams: saw eye doctor last year, vision stable.   - Dental exams: sees dentist regularly.   - Immunizations:    -Influenza: Encouraged flu shot during flu season.   -Shingles: Not indicated.   -TdaP/Tetanus: Up-to-date.   -Pneumonia: Not indicated.  - Safety: seatbelts - always, smoke detectors/carbon monoxide detectors - yes, sunscreen- yes.   - Advanced Directive: Health care proxy form provided to patient. Advised to take home, review, complete, then return to office.    I personally reviewed the patient's allergies, medications, medical history, surgical history, family history, social history reviewed, updated as documented below and in EMR.    Allergies:    Allergies   Allergen Reactions   . Shellfish-Derived Products Itching     Scratchy throat to shrimp     Current medications:    Current Outpatient Medications   Medication Sig   . omeprazole (PRILOSEC) 20 MG capsule Take 1 capsule (20 mg total) by mouth daily (before breakfast)   . rosuvastatin (CRESTOR) 20 mg tablet Take 1 tablet (20 mg total) by mouth daily   . famotidine (PEPCID) 20 mg tablet Take 1 tablet (20 mg total) by mouth 2 times daily as needed for Heartburn   . albuterol HFA (PROVENTIL, VENTOLIN, PROAIR HFA) 108 (90 Base) MCG/ACT inhaler Inhale 1-2 puffs into the lungs every 6 hours as needed  Shake well  before each use.   . mometasone (NASONEX) 50 MCG/ACT nasal spray Spray 2 sprays into each nostril daily   . fexofenadine (ALLEGRA) 180 MG tablet Take 1 tablet (180 mg total) by mouth daily     Problems:  Patient Active Problem List   Diagnosis Code   . BMI 28.0-28.9,adult Z68.28   . Hyperlipidemia, unspecified hyperlipidemia type E78.5   . Sinus pain J34.89   . Allergic rhinitis J30.9   . Prediabetes R73.03   . Cerebral palsy G80.9     Past Medical/Surgical History:   Past Medical History:   Diagnosis Date   . Arthritis    . Asthma    . Cerebral palsy    . GERD (gastroesophageal reflux disease)    . Hyperlipidemia      Past Surgical History:   Procedure Laterality Date   . HIP REPLACEMENT Right    . KIDNEY SURGERY       Family History:    Family History   Problem Relation Age of Onset   . Asthma Mother    . GERD Mother    . Hyperlipidemia Father    . Hypertension Father    . Diabetes Father    . High Blood Pressure Father    . Asthma Brother      Social/Occupational History:  Social History     Socioeconomic History   . Marital status: Married   Tobacco Use   . Smoking status: Former     Packs/day: 0.25     Years: 20.00     Pack years: 5.00     Types: Cigarettes     Quit date: 2015     Years since quitting: 8.2   . Smokeless tobacco: Never   Substance and Sexual Activity   . Alcohol use: Yes     Alcohol/week: 3.0 standard drinks of alcohol     Types: 3 Standard drinks or equivalent per week     Comment: socially   . Drug use: Not Currently   . Sexual activity: Yes     Partners: Female     Birth control/protection: None       Immunizations:  Immunization History   Administered Date(s) Administered   . Covid-19 mRNA vaccine (PFIZER) IM 30 mcg/0.95mL 04/20/2019, 05/18/2019   . Tdap 02/26/2021     Health Maintenance:  Health Maintenance Topics with due status: Overdue       Topic Date Due    HIV Screening USPSTF/[place] Never done    Hepatitis C Screening USPSTF/[place] Never done    COVID-19 Vaccine 07/13/2019    Colon Cancer  Screening USPSTF Never done     Health Maintenance Topics with due status: Not Due       Topic Last Completion Date    IMM DTaP/Tdap/Td 02/26/2021    DEPRESSION SCREEN YEARLY 02/28/2021    IMM-INFLUENZA Not Due     Health Maintenance Topics with due status: Aged Out       Topic Date Due    IMM Pneumo:Peds(0-41yrs) or At-Risk Patients(6-50yrs) Aged Out     Review of Systems:    Review of Systems   Constitutional: Negative for chills, fever, malaise/fatigue and weight loss.   HENT: Negative for ear pain and hearing loss.    Eyes: Negative for blurred vision, double vision and pain.   Respiratory: Negative for cough, sputum production, shortness of breath and wheezing.    Cardiovascular: Negative for chest pain, palpitations and leg swelling.   Gastrointestinal: Negative for abdominal pain, blood in stool, constipation, diarrhea, melena, nausea and vomiting.   Genitourinary: Negative for dysuria, frequency and urgency.   Musculoskeletal: Negative for back pain, joint pain and neck pain.   Skin: Negative for rash.   Neurological: Negative for dizziness, tingling, sensory change and loss of consciousness.   Psychiatric/Behavioral: Negative for depression. The patient is not nervous/anxious.        Physical Exam:   Vital Signs:   Vitals:    05/03/21 0857   BP: 118/70   BP Location: Left arm   Patient Position: Sitting   Pulse: 80   Temp: 36.2 C (97.1 F)   TempSrc: Temporal   SpO2: 97%   Weight: 73 kg (161 lb)   Height: 1.676 m (5\' 6" )     Body mass index is 25.99 kg/m.    PHYSICAL EXAM:  Physical Exam  Vitals and nursing note reviewed.   Constitutional:       General: He is not in acute distress.     Appearance: Normal appearance. He is well-developed. He is not ill-appearing or diaphoretic.   HENT:      Head: Normocephalic and atraumatic.      Right Ear: Tympanic membrane, ear canal and external ear normal. There is no impacted cerumen.      Left Ear: Tympanic membrane,  ear canal and external ear normal. There is no  impacted cerumen.      Nose: Nose normal. No congestion or rhinorrhea.      Mouth/Throat:      Mouth: Mucous membranes are moist.      Pharynx: Oropharynx is clear. No oropharyngeal exudate or posterior oropharyngeal erythema.   Eyes:      General: No scleral icterus.        Right eye: No discharge.         Left eye: No discharge.      Conjunctiva/sclera: Conjunctivae normal.      Pupils: Pupils are equal, round, and reactive to light.   Neck:      Thyroid: No thyromegaly.   Cardiovascular:      Rate and Rhythm: Normal rate and regular rhythm.      Heart sounds: Normal heart sounds. No murmur heard.     No friction rub. No gallop.   Pulmonary:      Effort: Pulmonary effort is normal. No respiratory distress.      Breath sounds: Normal breath sounds. No stridor. No wheezing, rhonchi or rales.   Abdominal:      General: Bowel sounds are normal. There is no distension.      Palpations: Abdomen is soft. There is no mass.      Tenderness: There is no abdominal tenderness. There is no guarding or rebound.      Hernia: No hernia is present.   Musculoskeletal:         General: Normal range of motion.      Cervical back: Normal range of motion and neck supple. No tenderness.      Right lower leg: No edema.      Left lower leg: No edema.   Lymphadenopathy:      Cervical: No cervical adenopathy.   Skin:     General: Skin is warm and dry.      Coloration: Skin is not jaundiced.   Neurological:      Mental Status: He is alert and oriented to person, place, and time. Mental status is at baseline.      Motor: No abnormal muscle tone.      Coordination: Coordination normal.   Psychiatric:         Behavior: Behavior normal.         Thought Content: Thought content normal.       The 10-year ASCVD risk score (Arnett DK, et al., 2019) is: 1.8%    Values used to calculate the score:      Age: 80 years      Sex: Male      Is Non-Hispanic African American: No      Diabetic: No      Tobacco smoker: No      Systolic Blood Pressure: 118  mmHg      Is BP treated: No      HDL Cholesterol: 47 mg/dL      Total Cholesterol: 179 mg/dL    Assessment and Plan:   Emilio Math  was seen today for annual exam.    1. Annual physical exam    2. Hyperlipidemia, unspecified hyperlipidemia type -continue rosuvastatin 20 mg daily.  Obtain updated lipid panel for monitoring.  - rosuvastatin (CRESTOR) 20 mg tablet; Take 1 tablet (20 mg total) by mouth daily  Dispense: 90 tablet; Refill: 3  - Comprehensive metabolic panel; Future  - Lipid Panel (Reflex to Direct  LDL if Triglycerides more than  400); Future    3. Allergic rhinitis  -continue fexofenadine and mometasone.    4. Gastroesophageal reflux disease, unspecified whether esophagitis present -discussed associated long-term risks of PPI therapy.  Prefer alternative agent, patient agreeable to trying to switch to famotidine.  - famotidine (PEPCID) 20 mg tablet; Take 1 tablet (20 mg total) by mouth 2 times daily as needed for Heartburn  Dispense: 180 tablet; Refill: 3    5. Screening for colon cancer  - AMB REFERRAL TO [facility]    6. Prediabetes  - Hemoglobin A1c; Future    7. Screening for HIV (human immunodeficiency virus)  - HIV 1&2 antigen/antibody; Future    8. Need for hepatitis C screening test  - Hepatitis C antibody; Future      Counseling performed/Health Maintenance discussed with patient:  -Lab results discussed  -Discussed overweight Body mass index is 25.99 kg/m.  -Smoking cessation discussed - denies smokign.   -Discussed age and sex appropriate recommendations for alcohol consumption   -Recreational drug use discussed - denies use.   -Depression screening evaluation performed today - negative PHQ-2.   -Skin CA awareness/prevention discussed   -Encouraged regular vision exams and twice yearly dental examinations   -Motor vehicle safety discussed   -Health care proxy/Advance directives discussed   -Immunizations: reviewed as above.  -Colon Cancer Screening (45-75): due,  colonoscopy referral signed.   -DEXA - not indicated.    -Lung cancer screening (current smokers or have quit within the past 15 years aged 63-79 with 20 pack year history or greater): not indicated.   -AAA screening - men >65 who have ever smoked - not indicated.   -Discussed current recommendations around PSA screening (ages 5 through 44) - not indicated.   -Hepatitis C screening - ordered.   -HIV screening - ordered.     Follow-up: Follow up in about 1 year (around 05/04/2022) for Annual exam.    Colbert Ewing, MD  [facility] [place]  05/03/21  9:35 AM

## 2021-05-03 NOTE — Patient Instructions (Signed)
See if you can switch to the famotidine instead of omeprazole.

## 2021-05-08 ENCOUNTER — Other Ambulatory Visit
Admission: RE | Admit: 2021-05-08 | Discharge: 2021-05-08 | Disposition: A | Discharge status: Home / Self Care | Payer: PRIVATE HEALTH INSURANCE | Source: Ambulatory Visit | Attending: Primary Care | Admitting: Primary Care

## 2021-05-08 DIAGNOSIS — Z114 Encounter for screening for human immunodeficiency virus [HIV]: Secondary | ICD-10-CM | POA: Insufficient documentation

## 2021-05-08 DIAGNOSIS — E785 Hyperlipidemia, unspecified: Secondary | ICD-10-CM

## 2021-05-08 DIAGNOSIS — R7303 Prediabetes: Secondary | ICD-10-CM | POA: Insufficient documentation

## 2021-05-08 DIAGNOSIS — Z1159 Encounter for screening for other viral diseases: Secondary | ICD-10-CM | POA: Insufficient documentation

## 2021-05-08 LAB — COMPREHENSIVE METABOLIC PANEL
ALT: 28 U/L (ref 0–50)
AST: 26 U/L (ref 0–50)
Albumin: 4.7 g/dL (ref 3.5–5.2)
Alk Phos: 142 U/L — ABNORMAL HIGH (ref 40–130)
Anion Gap: 11 (ref 7–16)
Bilirubin,Total: 0.3 mg/dL (ref 0.0–1.2)
CO2: 24 mmol/L (ref 20–28)
Calcium: 9.4 mg/dL (ref 8.6–10.2)
Chloride: 104 mmol/L (ref 96–108)
Creatinine: 0.94 mg/dL (ref 0.67–1.17)
Glucose: 87 mg/dL (ref 60–99)
Lab: 14 mg/dL (ref 6–20)
Potassium: 4.3 mmol/L (ref 3.3–5.1)
Sodium: 139 mmol/L (ref 133–145)
Total Protein: 7.7 g/dL (ref 6.3–7.7)
eGFR BY CREAT: 101 *

## 2021-05-08 LAB — LIPID PANEL
Chol/HDL Ratio: 4.3
Cholesterol: 197 mg/dL
HDL: 46 mg/dL (ref 40–60)
LDL Calculated: 117 mg/dL
Non HDL Cholesterol: 151 mg/dL
Triglycerides: 171 mg/dL — AB

## 2021-05-09 ENCOUNTER — Encounter: Payer: Self-pay | Admitting: Primary Care

## 2021-05-09 LAB — HIV 1&2 ANTIGEN/ANTIBODY: HIV 1&2 ANTIGEN/ANTIBODY: NONREACTIVE

## 2021-05-09 LAB — HEPATITIS C ANTIBODY: Hep C Ab: NEGATIVE

## 2021-05-09 LAB — HEMOGLOBIN A1C: Hemoglobin A1C: 5.6 %

## 2021-05-09 NOTE — Addendum Note (Signed)
Addended by: Colbert Ewing E on: 05/09/2021 11:36 AM     Modules accepted: Orders

## 2021-11-20 ENCOUNTER — Other Ambulatory Visit: Payer: Self-pay | Admitting: Primary Care

## 2021-11-20 DIAGNOSIS — Z1211 Encounter for screening for malignant neoplasm of colon: Secondary | ICD-10-CM

## 2021-12-14 ENCOUNTER — Encounter: Payer: Self-pay | Admitting: Primary Care

## 2021-12-14 LAB — AMB COLOGUARD (EXTERNAL LAB - EXACT SCIENCES): Cologuard (FIT DNA): NEGATIVE

## 2022-04-15 ENCOUNTER — Ambulatory Visit: Payer: PRIVATE HEALTH INSURANCE | Admitting: Orthopedic Surgery

## 2022-04-15 ENCOUNTER — Encounter: Payer: Self-pay | Admitting: Orthopedic Surgery

## 2022-04-15 ENCOUNTER — Other Ambulatory Visit: Payer: Self-pay

## 2022-04-15 ENCOUNTER — Ambulatory Visit
Admission: RE | Admit: 2022-04-15 | Discharge: 2022-04-15 | Disposition: A | Discharge status: Home / Self Care | Payer: PRIVATE HEALTH INSURANCE | Source: Ambulatory Visit | Admitting: Radiology

## 2022-04-15 VITALS — Ht 66.0 in | Wt 161.0 lb

## 2022-04-15 DIAGNOSIS — M25561 Pain in right knee: Secondary | ICD-10-CM

## 2022-04-15 DIAGNOSIS — M25861 Other specified joint disorders, right knee: Secondary | ICD-10-CM

## 2022-04-15 DIAGNOSIS — M25862 Other specified joint disorders, left knee: Secondary | ICD-10-CM

## 2022-04-15 DIAGNOSIS — M222X2 Patellofemoral disorders, left knee: Secondary | ICD-10-CM

## 2022-04-15 DIAGNOSIS — M222X1 Patellofemoral disorders, right knee: Secondary | ICD-10-CM

## 2022-04-15 NOTE — Progress Notes (Signed)
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 I have reviewed the patient intake forms for medications, past medical history, family history and social history. I agree with the NP's findings and plan of care as documented above. I have discussed the plan of care with both the NP and the patient. I was present during and supervised all procedures performed.    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   patella alta  I think strengthening his quad and working on atrophy could be helpful and he has noticed when he does gentle activities it is helpful for his pain  Has had some bad experiences with physical therapy where they try to aggressively work on his strength and range of motion I do not think that that approach would be helpful  Did I did dry needling 2 inch needles in his distal quads we will see how that impacts his pain  He tends to have a pain episode once a month or so overall  I think he can do activities as tolerated    Austin Quarry, MD     Austin Rice is seen today at our SW location, unaccompanied.     CC: Right knee pain in the distal quad    HPI: Austin Rice is a 47 year old gentleman who has underlying CP (affecting his right side) and has undergone multiple previous surgical procedures including bilateral hip derotational surgery, bilateral hamstring lengthening, right calf and heel cord lengthening, and spatial frame for right leg lengthening as a child Austin Rice).  He had right total hip replacement in [place] in 2016.  He  did attend formal physical therapy for a year after that and did really well with a hip replacement with no pain.     He saw Dr. Clydene Rice 08/2020 for his LEFT knee pain which was located in the infrapatellar region, and received an infrapatellar nerve sheath injection.  He reports this essentially resolved his left knee pain and he has not had trouble since.  Today he is here for evaluation of his RIGHT knee.  He reports this pain is different than his left.  Pain is located in the distal quad.  No frank locking catching or giving way.  Although, he reports that when the pain occurs it is always with weightbearing activities and he sits down and rests it.  He will take 2 Aleve used heat and ice and the pain generally resolves in 2 to 3 days.  He does ambulate with the use of a Lofstrand crutch in the left hand.  No numbness tingling back pain or groin pain.  He is looking for a long-term plan/outlook for his right knee today as these flair up's occur maybe once per month right now.     Patient has tried:  [x]  icing  []  elevation   []  rest  [x]  heat  [x]  anti-inflammatories: Aleve two as needed or Advil prn  []  Tylenol  []  Cortisone injection:  []  Visco supplementation:  []  PT  []   Brace  []  Chiropractic  []  Massage therapy  []  Acupuncture  [x]  Other: _lofstrand crutch left hand__      Vitals:    04/15/22 0919   Weight: 73 kg (161 lb)   Height: 1.676 m (5\' 6" )     Pain    04/15/22 0919 04/15/22 0920   PainSc:   6   6   PainLoc: Knee Knee     PE:  PERRL, EOMI  Alert, able to answer questions appropriately  No distress with breathing, able to finish sentences  Cap refill < 3 seconds in distal extremities  No skin breakdown, no warmth, erythema  Normal sensation distally   Right knee active range of motion lacks 5 degrees of extension flexes to 100 degrees passively active assist to 110  (For comparison his left knee lacks 7 degrees of extension and flexes to 120)  No tenderness to palpation over medial or lateral joint  lines, no pain over patellar facets  Significant right greater than left quad atrophy  Right calf atrophy as well  Ligamentously stable  He has a spastic CP gait with reduced right arm swing.  He can ambulate safely without the use of the Lofstrand crutch for short periods.  Negative logroll  Right hip has restricted rotation but no pain with movement    IMAGING: Radiographs 3 views weightbearing right knee AP lateral and sunrise views 1 AP view of the left knee obtained today in clinic.  Upon review: Severe degenerative changes of the right knee noted.  There is irregularity, significant osteophyte formation and superior migration of the patella.  No acute fracture or dislocation.  AP view of the left knee shows mild degenerative changes.        ICD-10-CM ICD-9-CM   1. Right knee pain  M25.561 719.46       ASSESSMENT AND PLAN:   -Patient is suffering from significant degenerative changes of his right knee in the setting of underlying CP affecting this right leg.  He has previously had right hip replacement surgery in [place] and did well.  Pain is in the distal quad on the right side.    -Options for management were discussed with the patient today.  I do believe he would benefit from physical therapy for eccentric control in his right lower extremity.  We are going to have him see our therapist Austin Rice at [facility] for this.    -Additionally, we discussed dry needling the distal quad versus formal acupuncture.  He did elect to proceed with dry needling today.    -Previously responded incredibly well to an infrapatellar nerve block injection on the left knee.  He may benefit from this on the right however he has pain in a different location on the right.  I think a lot of the pain is patellofemoral in nature on his right side hopefully physical therapy will help and improving track in of his patellofemoral region and strength of his quad.    -May benefit from intra-articular right knee injection.    -Patient  was advised in the appropriate dosing of acetaminophen and anti-inflammatories and advised of GI side effects to monitor for.  He does use Aleve to as needed or Advil.  -Patient was advised in the appropriate ice/heat symptomatic management   -Follow up in about 6 weeks (around 05/27/2022).    Orders Placed This Encounter   Procedures    Knee 3 view RIGHT 1 view LEFT    AMB REFERRAL TO PHYSICAL /  OCCUPATIONAL THERAPY - [facility]     Answers for HPI/ROS submitted by the patient on 08/27/2020  What is your goals for today's visit?: Cortizone shot  Date of onset: : 01/29/2020  Was this the result of an injury?: No  What is your pain level?: 7/10  Please describe the quality of your pain: : aching, shooting  What diagnostic workup have you had for this condition?: MRI scan, X-ray  What treatments have you tried for this condition?: bracing, crutches, ice  Progression since onset: : gradually worsening  Is this a work related condition? : No  Current work status: : usual activities  Fever: No  Chills: No  Numbness: No  Tingling: No      Answers submitted by the patient for this visit:  Right knee (Submitted on 04/15/2022)  Chief Complaint: RIGHT KNEE PAIN HPI  Was this the result of an injury?: No  What is your pain level?: 5/10  Please describe the quality of your pain: : sharp  What diagnostic workup have you had for this condition?: no prior workup  What treatments have you tried for this condition?: acetaminophen, bracing, heat, ice  Progression since onset: : unchanged since onset  Is this a work related condition? : No  Current work status: : usual activities

## 2022-05-07 ENCOUNTER — Ambulatory Visit: Payer: PRIVATE HEALTH INSURANCE | Attending: Primary Care | Admitting: Rehabilitative and Restorative Service Providers"

## 2022-05-07 ENCOUNTER — Other Ambulatory Visit: Payer: Self-pay

## 2022-05-07 DIAGNOSIS — M25561 Pain in right knee: Secondary | ICD-10-CM | POA: Insufficient documentation

## 2022-05-07 DIAGNOSIS — G8929 Other chronic pain: Secondary | ICD-10-CM | POA: Insufficient documentation

## 2022-05-07 NOTE — Progress Notes (Signed)
[facility] REHABILITATION LE EVALUATION      Diagnosis: R knee pain, patella alta  Onset date:  Chronic- 3 years  Date of surgery: NA    History    Austin Rice is a 47 y.o. male who is present today for right knee care.   Mechanism of injury/history of symptoms:  No specific cause.  History of CP affecting his R side.  Notes that he has never been able to fully extend his knee on the right side and has also had significant quad atrophy as well.  Reports that every so often he takes a step and gets a sharp pain in the superior aspect of his knee.  History of R hip replacement due to chronic issues with his joint.  Reports that he has attempted PT in the past and typically they try to regain full motion and strength but this has never been the same as the L side for him and has not been helpful.      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  Other: NA  Diagnostic tests: Per report, reviewed, X-ray    Symptom location: Anterior and Superior, right  Relevant symptoms:  Aching, Sharp, Pain , Decreased strength  Symptom frequency: Intermittent  Symptom intensity:  (0 - 10 scale): Now 0 Best 0 Worst 7   Night Pain: No   Restful sleep:   Yes  Morning Pain/Stiffness: Unchanged   Symptoms worsen with: Standing, Walking  Symptoms improve with: Rest  Assistive device:  crutches- Lofstrand on R side  Patient's goals for therapy: Perform ambulation at safe level with or without assistive device to promote safe community activity/ social function (shopping, attendance at appointments/events), Reduce pain, Increase strength, Achieve independence with home program for self care / condition management     Objective    Observation:  patella alta bilaterally  Gait:   history     Lumbar Screen:  Not Tested  Neurologic:  NA    Palpation:   patella alta  bilaterally  Incision:  NA      ROM/Strength            KNEE LEFT RIGHT STRENGTH    PROM AROM PROM AROM Left Right   Flexion 125  112  5/5 4/5   Extension 0  +12  5/5 4/5                       LE Flexibility  Iliopsoas: Poor  Quadricep:  Poor  Hamstring:  Poor      Functional:  Ascend stairs with reciprocal gait - able to perform with 2 handrails.  Descend stairs with reciprocal gait - able to perform with 2 handrails.  Notes negotiating down stairs is challenging.      Assessment:    Findings consistent with 47 y.o. male with R knee pain, patella alta with pain, ROM limitations, strength limitations, functional limitations.    Personal factors affecting treatment/recovery:  Previous poor response to PT  Comorbidities affecting treatment/recovery:  Cerebral palsy (CP)  Total hip replacement  Clinical presentation:  stable  Patient complexity:    moderate level as indicated by above stability of condition, personal factors, environmental factors and comorbidities in addition to patient symptom presentation and impairments found on physical exam.    Prognosis:  Good  Contraindications/Precautions/Limitation:  Per diagnosis  Short Term Goals: (4 week(s)): Decrease c/o max pain to < 4/10, Increase ROM /flexibility to WNL, Increase strength 4+/5 in all directions, and Minimal assistance with HEP/ education concepts  Long Term Goals: (3 month(s)): Pain/Sx 0 - minimal, ROM/ flexibility WNL , Restoration of functional strength, Independent with HEP/education , Functional return to ADLs / activities without limitations     Treatment Plan:   Options / plan reviewed with patient/family:  Yes  Freq 1-2 times per week for 3 month(s)    Treatment plan inclusive of:   Exercise: AROM, AAROM, PROM, Stretching, Strengthening, Progressive Resistive   Manual Techniques:  Joint mobilization   Modalities:  Cold pack, Ther Exercise per flowsheet   Functional: Proprioception/Dynamic stability, Functional rehab, Gait training, Balance      Thank you for referring this patient to [facility].    Rueben Bash Terral Cooks, PT    Patella mobilization 3 x 30''   Prone quad stretch 3 x 30''   TKE Red 10x 5''                                  Minutes   Time Based CPT  Physical Performance test, Therapeutic Exercise, Therapeutic Activities, NM Re-Education, Manual Therapy, Gait Training, Massage, Aquatic Therapy, Canalith Repositioning, Iontophoresis, Ultrasound, Orthotic fitting/training, Prosthetic fitting 15   Service Based (untimed) CPT  PT/OT evaluation, PT/OT re-eval, E-stim-unattended, Mechanical traction, Vasopneumatic device 30   Unbilled time (rest, etc)        Total Treatment Time 45

## 2022-05-20 ENCOUNTER — Other Ambulatory Visit: Payer: Self-pay

## 2022-05-20 ENCOUNTER — Ambulatory Visit: Payer: PRIVATE HEALTH INSURANCE | Admitting: Rehabilitative and Restorative Service Providers"

## 2022-05-20 DIAGNOSIS — M25561 Pain in right knee: Secondary | ICD-10-CM

## 2022-05-20 NOTE — Progress Notes (Signed)
[facility] Orthopedic Sports/Spine Rehabilitation  PT Treatment Note      Name: NOVAH TORRUELLA  DOB: 1975-03-03  Referring Physician: Shearon Balo, MD  Diagnosis:   1. Chronic pain of right knee              Subjective:  Pain Assessment: 0  Pt denies any episodes of pain since the last session.  Has noticed improved flexibility with the stretching exercises.         Objective:  ROM -  Right, Knee, ext 10, flexion 120  Strength - Therapeutic Exercises per flowsheet  Function: -  denies any issues with painful episodes  Education:  Updated HEP, Verbal cues for ther ex, Manual cues for ther ex    Objective      Patella mobilization 3 x 30''   Prone quad stretch 3 x 30''   Hip flexor stretch off edge 3 x 30''   TKE PURPLE 10x 5''   Glider- ant/post 2 x 5   Cybex LP- single leg 35lb 3 x 10                           Treatment:  Ther Exercise per flowsheet    Assessment:   Pt has demonstrated improvement in flexibility.  Has not had any incidents of painful episodes that were previously occurring.  Discussed continuing to work on his exercises. Will follow up in 2 weeks for additional progressions of the exercises.        Plan of Care:  Continue per Plan of care -  As written; Patient would benefit from skilled rehabilitation services to address the above impairments to restore functional capacity.    Thank you for referring this patient to [facility] - Sports and Spine Rehabilitation    Isabella Bowens, PT       Minutes   Time Based CPT  Physical Performance test, Therapeutic Exercise, Therapeutic Activities, NM Re-Education, Manual Therapy, Gait Training, Massage, Aquatic Therapy, Canalith Repositioning, Iontophoresis, Ultrasound, Orthotic fitting/training, Prosthetic fitting 30   Service Based (untimed) CPT  PT/OT evaluation, PT/OT re-eval, E-stim-unattended, Mechanical traction, Vasopneumatic device    Unbilled time (rest, etc)        Total Treatment Time 30

## 2022-05-30 ENCOUNTER — Encounter: Payer: Self-pay | Admitting: Orthopedic Surgery

## 2022-05-30 ENCOUNTER — Other Ambulatory Visit: Payer: Self-pay

## 2022-05-30 ENCOUNTER — Ambulatory Visit: Payer: PRIVATE HEALTH INSURANCE | Admitting: Orthopedic Surgery

## 2022-05-30 VITALS — BP 131/83 | HR 77 | Ht 66.0 in | Wt 148.3 lb

## 2022-05-30 DIAGNOSIS — G809 Cerebral palsy, unspecified: Secondary | ICD-10-CM

## 2022-05-30 DIAGNOSIS — M1711 Unilateral primary osteoarthritis, right knee: Secondary | ICD-10-CM

## 2022-05-30 NOTE — Progress Notes (Signed)
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 I have reviewed the patient intake forms for medications, past medical history, family history and social history. I agree with the NP's findings and plan of care as documented above. I have discussed the plan of care with both the NP and the patient. I was present during and supervised all procedures performed.    Dry needling was helpful for his pain  Rehab and HEP has been very helpful  Cont his HEP   Can return prn for rehab tune up or dry needling  Consider PMR referral for botox prn  Consider acupuncture    Bing Quarry, MD     Austin Rice is seen today at our SW location, unaccompanied.     CC: Right knee pain in the distal quad s/p dry needling to the distal quad and formal PT from 04/15/22.     HPI: Austin Rice returns to the office for repeat evaluation of his right knee. Patient reports significant relief with the dry needling to his right distal quad. He also reports PT has been very helpful. He has not had any flare ups since these. He has no right knee pain today.     History:  -Has underlying CP (affecting his right side) and has undergone multiple previous surgical procedures including bilateral hip derotational surgery, bilateral hamstring lengthening, right calf and heel cord lengthening, and spatial frame for right leg lengthening as a child [name]).  He had right total hip replacement in [place] in 2016.  He did attend formal physical therapy for a year after that and did really well with a hip replacement with no pain.  -He saw Dr. Clydene Fake 08/2020 for his LEFT knee pain which was located in the infrapatellar region, and received an infrapatellar nerve sheath injection.  He reports this essentially resolved his left knee pain and he has not had trouble since.    -Right knee pain- Pain is located in the distal quad.  No frank locking catching or giving way.  Although, he reports that when the  pain occurs it is always with weightbearing activities and he sits down and rests it.  He will take 2 Aleve used heat and ice and the pain generally resolves in 2 to 3 days. It tends to flare about once per month, randomly.  He does ambulate with the use of a Lofstrand crutch in the left hand.  No numbness tingling back pain or groin pain.      Patient has tried:  [x]  icing  []  elevation   []  rest  [x]  heat  [x]  anti-inflammatories: Aleve two as needed or Advil prn  []  Tylenol  [x]  Cortisone injection:Overall left knee has done well since the infrapatellar nerve sheath injection. Right knee has not had injections.   []  Visco supplementation:  [x]  PT- right knee helpful  []  Brace  []  Chiropractic  []  Massage therapy  [x]  Dry needling to right distal quad, never had formal Acupuncture  [x]  Other: _lofstrand crutch left hand__      Vitals:    05/30/22 1057   BP: 131/83   Pulse: 77   Weight: 67.3 kg (148 lb 4.8 oz)   Height: 1.676 m (5\' 6" )     Pain    05/30/22 1057   PainSc:   0 - No pain   PainLoc: Knee     PE:  PERRL, EOMI  Alert, able to answer questions  appropriately  No distress with breathing, able to finish sentences  Cap refill < 3 seconds in distal extremities  No skin breakdown, no warmth, erythema  Normal sensation distally   Right knee active range of motion lacks 5 degrees of extension flexes to 100 degrees passively active assist to 110  (For comparison his left knee lacks 7 degrees of extension and flexes to 120)  No tenderness to palpation over medial or lateral joint lines, no pain over patellar facets  Significant right greater than left quad atrophy  Right calf atrophy as well  Ligamentously stable  He has a spastic CP gait with reduced right arm swing.  He can ambulate safely without the use of the Lofstrand crutch for short periods.  Negative logroll  Right hip has restricted rotation but no pain with movement    IMAGING: Radiographs 3 views weightbearing right knee AP lateral and sunrise views 1  AP view of the left knee obtained at his last visit.  Upon review: Severe degenerative changes of the right knee worst in the PF compartment.  There is irregularity, significant osteophyte formation and superior migration of the patella/patella alta.  No acute fracture or dislocation.  AP view of the left knee shows mild degenerative changes.      ICD-10-CM ICD-9-CM   1. Osteoarthritis of right knee  M17.11 715.96   2. Cerebral palsy, unspecified type  G80.9 343.9         ASSESSMENT AND PLAN:   -Patient is suffering from significant degenerative changes of his right knee in the setting of underlying CP affecting this right leg.  He has previously had right hip replacement surgery in [place] and did well.  Pain is in the distal quad on the right side. Dry needling was very helpful. Discussed if pain re-occurs could repeat dry needling or he could try acupuncture. He would like to see how he does over the next weeks-month/   Continued PT with Aundra Millet at [facility] as this has been helpful. Transition to HEP.     -May benefit from seeing PMR for botox injections for spacticity.     -Previously responded incredibly well to an infrapatellar nerve block injection on the left knee.  He may benefit from this on the right however he has pain in a different location on the right.  I think a lot of the pain is patellofemoral in nature on his right side. May benefit from intra-articular right knee injection.    -Patient was advised in the appropriate dosing of acetaminophen and anti-inflammatories and advised of GI side effects to monitor for.  He does use Aleve to as needed or Advil.  -Patient was advised in the appropriate ice/heat symptomatic management   -Follow up if symptoms worsen or fail to improve.    No orders of the defined types were placed in this encounter.    Answers for HPI/ROS submitted by the patient on 08/27/2020  What is your goals for today's visit?: Cortizone shot  Date of onset: : 01/29/2020  Was this  the result of an injury?: No  What is your pain level?: 7/10  Please describe the quality of your pain: : aching, shooting  What diagnostic workup have you had for this condition?: MRI scan, X-ray  What treatments have you tried for this condition?: bracing, crutches, ice  Progression since onset: : gradually worsening  Is this a work related condition? : No  Current work status: : usual activities  Fever: No  Chills: No  Numbness: No  Tingling: No      Answers submitted by the patient for this visit:  Right knee (Submitted on 04/15/2022)  Chief Complaint: RIGHT KNEE PAIN HPI  Was this the result of an injury?: No  What is your pain level?: 5/10  Please describe the quality of your pain: : sharp  What diagnostic workup have you had for this condition?: no prior workup  What treatments have you tried for this condition?: acetaminophen, bracing, heat, ice  Progression since onset: : unchanged since onset  Is this a work related condition? : No  Current work status: : usual activities

## 2022-05-30 NOTE — Patient Instructions (Signed)
Austin Rice/Austin Rice  [facility]  [email or web address]  [phone]      Darlene Easton   [facility] in [place]  [email or web address]  [phone]      Renee Nearpass   [facility]  [email or web address]  [phone]      Derek Barclay   [facility] in [place]  [email or web address]  [phone]    Patrick Boswell  [facility] in [place]  [phone]  [email or web address]

## 2022-06-05 ENCOUNTER — Ambulatory Visit: Payer: PRIVATE HEALTH INSURANCE | Attending: Primary Care | Admitting: Rehabilitative and Restorative Service Providers"

## 2022-06-05 ENCOUNTER — Other Ambulatory Visit: Payer: Self-pay

## 2022-06-05 DIAGNOSIS — G8929 Other chronic pain: Secondary | ICD-10-CM | POA: Insufficient documentation

## 2022-06-05 DIAGNOSIS — M25561 Pain in right knee: Secondary | ICD-10-CM | POA: Insufficient documentation

## 2022-06-05 NOTE — Progress Notes (Signed)
[facility] Orthopedic Sports/Spine Rehabilitation  PT Treatment Note      Name: Austin Rice  DOB: [birth year]  Referring Physician: Shearon Balo, MD  Diagnosis:   1. Chronic pain of right knee              Subjective:  Pain Assessment: 0  Pt reports that he has been feeling much better.  Has not had any incidents or episodes with his knee since initiating therapy.         Objective:  ROM -  Right, Knee, ext 10, flexion 120  Strength - Therapeutic Exercises per flowsheet  Function: -  denies any issues or painful episodes  Education:  Updated HEP, Verbal cues for ther ex, Manual cues for ther ex    Objective      Patella mobilization 3 x 30''   Prone quad stretch 3 x 30''   Hip flexor stretch off edge 3 x 30''   TKE PURPLE 10x 5''   Glider- ant/post 2 x 5   Cybex LP- single leg 40lb 3 x 10                           Treatment:  Ther Exercise per flowsheet    Assessment:   Pt has continued to make strides toward ROM and flexibility.  He will continue to perform his exercises at home and discussed an appropriate maintenance program for this. He will reach out if any issues arise.       Plan of Care:  Continue per Plan of care -  As written; Patient would benefit from skilled rehabilitation services to address the above impairments to restore functional capacity.    Thank you for referring this patient to [facility] Orthopaedics - Sports and Spine Rehabilitation    Isabella Bowens, PT       Minutes   Time Based CPT  Physical Performance test, Therapeutic Exercise, Therapeutic Activities, NM Re-Education, Manual Therapy, Gait Training, Massage, Aquatic Therapy, Canalith Repositioning, Iontophoresis, Ultrasound, Orthotic fitting/training, Prosthetic fitting 30   Service Based (untimed) CPT  PT/OT evaluation, PT/OT re-eval, E-stim-unattended, Mechanical traction, Vasopneumatic device    Unbilled time (rest, etc)        Total Treatment Time 30

## 2022-07-23 ENCOUNTER — Other Ambulatory Visit: Payer: Self-pay | Admitting: Primary Care

## 2022-07-23 DIAGNOSIS — E785 Hyperlipidemia, unspecified: Secondary | ICD-10-CM

## 2022-07-23 NOTE — Telephone Encounter (Signed)
Last Appointment: 05/03/2021  Next Appointment: No future appointments.

## 2022-10-21 ENCOUNTER — Other Ambulatory Visit: Payer: Self-pay | Admitting: Primary Care

## 2022-10-21 ENCOUNTER — Other Ambulatory Visit: Payer: Self-pay | Admitting: Internal Medicine

## 2022-10-21 DIAGNOSIS — E785 Hyperlipidemia, unspecified: Secondary | ICD-10-CM

## 2022-10-21 NOTE — Telephone Encounter (Signed)
Last Appointment: 05/03/2021  Next Appointment: No future appointments.

## 2022-11-17 ENCOUNTER — Other Ambulatory Visit: Payer: Self-pay | Admitting: Primary Care

## 2022-11-17 DIAGNOSIS — E785 Hyperlipidemia, unspecified: Secondary | ICD-10-CM

## 2022-11-17 NOTE — Telephone Encounter (Signed)
Last office visit:   05/03/2021  Last telehome visit:   Visit date not found  Patients upcoming appointments:  No future appointments.  BP Readings from Last 3 Encounters:   05/30/22 131/83   05/03/21 118/70   03/08/21 127/70      Recent Lab results:  GENERAL CHEMISTRY   No value within the past 365 days   LIPID PROFILE   No value within the past 365 days   LIVER PROFILE   No value within the past 365 days   DIABETES THYROID   No value within the past 365 days No value within the past 365 days      Pending/Orders Labs:  Lab Frequency Next Occurrence

## 2022-11-22 ENCOUNTER — Encounter: Payer: Self-pay | Admitting: Primary Care

## 2022-11-22 ENCOUNTER — Ambulatory Visit: Payer: PRIVATE HEALTH INSURANCE | Admitting: Primary Care

## 2022-11-22 ENCOUNTER — Other Ambulatory Visit: Payer: Self-pay

## 2022-11-22 ENCOUNTER — Telehealth: Payer: Self-pay | Admitting: Primary Care

## 2022-11-22 VITALS — BP 136/70 | HR 90 | Temp 97.8°F | Resp 16 | Wt 145.4 lb

## 2022-11-22 DIAGNOSIS — J309 Allergic rhinitis, unspecified: Secondary | ICD-10-CM

## 2022-11-22 DIAGNOSIS — E785 Hyperlipidemia, unspecified: Secondary | ICD-10-CM

## 2022-11-22 DIAGNOSIS — R7303 Prediabetes: Secondary | ICD-10-CM

## 2022-11-22 DIAGNOSIS — K219 Gastro-esophageal reflux disease without esophagitis: Secondary | ICD-10-CM

## 2022-11-22 MED ORDER — ROSUVASTATIN CALCIUM 20 MG PO TABS *I*
20.0000 mg | ORAL_TABLET | Freq: Every day | ORAL | 3 refills | Status: DC
Start: 2022-11-22 — End: 2023-03-26

## 2022-11-22 NOTE — Telephone Encounter (Signed)
Pt overdue for physical  No physicals til February  Pt will come in for fuv

## 2022-11-22 NOTE — Addendum Note (Signed)
Addended by: Hermenia Bers on: 11/22/2022 04:23 PM     Modules accepted: Orders

## 2022-11-22 NOTE — Progress Notes (Signed)
 Follow Up Visit    Austin Rice is a 47 y.o. male presents today for   Chief Complaint   Patient presents with    Medication Refill     Rosuvastatin       HPI    History of Present Illness  The patient presents for follow-up of hyperlipidemia, GERD, and prediabetes.    For hyperlipidemia, he continues on rosuvastatin 20 mg daily. Denies any side effects such as muscle aches.    Regarding GERD, he has not been using omeprazole or famotidine regularly as his symptoms have not been consistent. Continues occasional as needed use.     He keeps an albuterol inhaler for asthma but has not required its use for several years.    He plans to schedule a physical examination in February 2025.    Patient Active Problem List   Diagnosis Code    BMI 28.0-28.9,adult Z68.28    Hyperlipidemia, unspecified hyperlipidemia type E78.5    Sinus pain J34.89    Allergic rhinitis J30.9    Prediabetes R73.03    Cerebral palsy G80.9       Current Outpatient Medications:     rosuvastatin (CRESTOR) 20 mg tablet, TAKE 1 TABLET(20 MG) BY MOUTH DAILY, Disp: 30 tablet, Rfl: 0    mometasone (NASONEX) 50 MCG/ACT nasal spray, Spray 2 sprays into each nostril daily, Disp: 17 g, Rfl: 5    fexofenadine (ALLEGRA) 180 MG tablet, Take 1 tablet (180 mg total) by mouth daily, Disp: 30 tablet, Rfl: 11    omeprazole (PRILOSEC) 20 MG capsule, Take 1 capsule (20 mg total) by mouth daily (before breakfast)., Disp: , Rfl:     famotidine (PEPCID) 20 mg tablet, Take 1 tablet (20 mg total) by mouth 2 times daily as needed for Heartburn, Disp: 180 tablet, Rfl: 3    albuterol HFA (PROVENTIL, VENTOLIN, PROAIR HFA) 108 (90 Base) MCG/ACT inhaler, Inhale 1-2 puffs into the lungs every 6 hours as needed. Shake well before each use., Disp: , Rfl:   Past Medical History:   Diagnosis Date    Arthritis     Asthma     Cerebral palsy     GERD (gastroesophageal reflux disease)     Hyperlipidemia        ROS  -As per HPI    BP 136/70 (BP Location: Left arm, Patient Position:  Sitting, Cuff Size: adult)   Pulse 90   Temp 36.6 C (97.8 F) (Temporal)   Resp 16   Wt 66 kg (145 lb 6.4 oz)   SpO2 98%   BMI 23.47 kg/m     Physical Exam  Vitals and nursing note reviewed.   Constitutional:       General: He is not in acute distress.     Appearance: He is well-developed. He is not diaphoretic.   HENT:      Head: Normocephalic and atraumatic.   Eyes:      General: No scleral icterus.        Right eye: No discharge.         Left eye: No discharge.      Conjunctiva/sclera: Conjunctivae normal.   Cardiovascular:      Rate and Rhythm: Normal rate and regular rhythm.      Heart sounds: Normal heart sounds. No murmur heard.     No friction rub. No gallop.   Pulmonary:      Effort: Pulmonary effort is normal. No respiratory distress.      Breath sounds:  Normal breath sounds. No stridor. No wheezing, rhonchi or rales.   Neurological:      Mental Status: He is alert.   Psychiatric:         Behavior: Behavior normal.         Thought Content: Thought content normal.         Assessment/Plan    1. Hyperlipidemia, unspecified hyperlipidemia type  - Lipid Panel (Reflex to Direct  LDL if Triglycerides more than 400); Future  - Basic metabolic panel; Future    2. Gastroesophageal reflux disease, unspecified whether esophagitis present    3. Prediabetes  - Hemoglobin A1c; Future    4. Allergic rhinitis    Assessment & Plan  1. Hyperlipidemia - Continues on rosuvastatin 20 mg daily  - No changes    2. GERD - Does not consistently take omeprazole or famotidine as symptoms have improved  - Advised to use as needed    3. Allergic Rhinitis - Continues on mometasone nasal spray and fexofenadine  - No changes    4. Asthma - Keeps albuterol inhaler on hand but has not needed it in years  - Advised to keep available as needed    5. Prediabetes - No specific treatment changes  - Blood work will be updated before next visit    6. Elevated Blood Pressure - Initial reading 142/80, normalized to 136/68 upon recheck  - No  immediate changes    Follow-up  - Return for annual physical in February 2025      Follow up in about 4 months (around 03/25/2023) for Annual exam.    Colbert Ewing, MD  [facility] [place]  11/22/2022

## 2023-03-26 ENCOUNTER — Other Ambulatory Visit: Payer: Self-pay

## 2023-03-26 ENCOUNTER — Encounter: Payer: Self-pay | Admitting: Primary Care

## 2023-03-26 ENCOUNTER — Ambulatory Visit: Payer: PRIVATE HEALTH INSURANCE | Admitting: Primary Care

## 2023-03-26 VITALS — BP 121/77 | HR 78 | Temp 97.9°F | Resp 18 | Ht 65.0 in | Wt 145.0 lb

## 2023-03-26 DIAGNOSIS — Z0000 Encounter for general adult medical examination without abnormal findings: Secondary | ICD-10-CM

## 2023-03-26 DIAGNOSIS — R7303 Prediabetes: Secondary | ICD-10-CM

## 2023-03-26 DIAGNOSIS — K219 Gastro-esophageal reflux disease without esophagitis: Secondary | ICD-10-CM

## 2023-03-26 DIAGNOSIS — Z23 Encounter for immunization: Secondary | ICD-10-CM

## 2023-03-26 DIAGNOSIS — E785 Hyperlipidemia, unspecified: Secondary | ICD-10-CM

## 2023-03-26 MED ORDER — ROSUVASTATIN CALCIUM 20 MG PO TABS *I*
20.0000 mg | ORAL_TABLET | Freq: Every day | ORAL | 3 refills | Status: AC
Start: 2023-03-26 — End: ?

## 2023-03-26 NOTE — Progress Notes (Signed)
 History and Physical    Chief Complaint   Patient presents with    Chief Complaint   Patient presents with    Annual Exam        History of Present Illness:   HPI:  Austin Rice is a 48 y.o. male with PMH notable for hyperlipidemia and prediabetes who presents today for annual exam.    For hyperlipidemia he continues on rosuvastatin 20 mg daily.    GERD: For GERD he continues omeprazole daily as needed or famotidine up to 20 mg twice daily as needed.    Health Maintenance:  - Diet: Continues with healthy diet, well rounded meals.   - Exercise: continues with weekly walk, golfing and bowling.   - Vision exams: sees eye doctor regularly overall.   - Dental exams: sees dentist regularly.   - Immunizations:               -Influenza: Flu shot given today.              -Shingles: Not indicated.              -TdaP/Tetanus: Up-to-date.              -Pneumonia: Not indicated.   -Hepatitis B: Recommended, patient declines.  Removed from care gaps given patient declined.  - Safety: seatbelts - always, smoke detectors/carbon monoxide detectors - yes, sunscreen- yes.   - Advanced Directive: Health care proxy form previously provided to patient. Advised to take home, review, complete, then return to office.    I personally reviewed the patient's allergies, medications, medical history, surgical history, family history, social history reviewed, updated as documented below and in EMR.    Allergies:    Allergies   Allergen Reactions    Shellfish-Derived Products Itching     Scratchy throat to shrimp     Current medications:    Current Outpatient Medications   Medication Sig    famotidine (PEPCID) 20 mg tablet Take 1 tablet (20 mg total) by mouth 2 times daily as needed for Heartburn    albuterol HFA (PROVENTIL, VENTOLIN, PROAIR HFA) 108 (90 Base) MCG/ACT inhaler Inhale 1-2 puffs into the lungs every 6 hours as needed. Shake well before each use.    mometasone (NASONEX) 50 MCG/ACT nasal spray Spray 2 sprays into each nostril daily     fexofenadine (ALLEGRA) 180 MG tablet Take 1 tablet (180 mg total) by mouth daily    omeprazole (PRILOSEC) 20 MG capsule Take 1 capsule (20 mg total) by mouth daily as needed.    rosuvastatin (CRESTOR) 20 mg tablet Take 1 tablet (20 mg total) by mouth daily.     Problems:  Patient Active Problem List   Diagnosis Code    Hyperlipidemia, unspecified hyperlipidemia type E78.5    Sinus pain J34.89    Allergic rhinitis J30.9    Prediabetes R73.03    Cerebral palsy G80.9     Past Medical/Surgical History:   Past Medical History:   Diagnosis Date    Arthritis     Asthma     Cerebral palsy     GERD (gastroesophageal reflux disease)     Hyperlipidemia      Past Surgical History:   Procedure Laterality Date    HIP REPLACEMENT Right     KIDNEY SURGERY       Family History:    Family History   Problem Relation Age of Onset    Asthma Mother     GERD Mother  Hyperlipidemia Father     Hypertension Father     Diabetes Father     High Blood Pressure Father     Asthma Brother      Social/Occupational History:   Social History     Socioeconomic History    Marital status: Married   Tobacco Use    Smoking status: Former     Packs/day: 0.25     Years: 20.00     Additional pack years: 0.00     Total pack years: 5.00     Types: Cigarettes     Quit date: 2015     Years since quitting: 10.1    Smokeless tobacco: Never   Substance and Sexual Activity    Alcohol use: Yes     Alcohol/week: 3.0 standard drinks of alcohol     Types: 3 Standard drinks or equivalent per week     Comment: socially    Drug use: Not Currently    Sexual activity: Yes     Partners: Female     Birth control/protection: None       Immunizations:  Immunization History   Administered Date(s) Administered    COVID-19 mRNA Vaccine [employer] Comirnaty) 12 Years+ 04/20/2022    Covid-19 mRNA vaccine (PFIZER) IM 30 mcg/0.58mL 04/20/2019, 05/18/2019    Influenza Quad 0.64mL prefilled syringe/single dose vial (FluLaval,Fluzone,Afluria,Fluarix)Historical 04/20/2022    Tdap  02/26/2021     Health Maintenance:  Health Maintenance Topics with due status: Overdue       Topic Date Due    IMM-Influenza 09/29/2022    COVID-19 Vaccine 09/29/2022     Health Maintenance Topics with due status: Postponed       Topic Postponed Until    IMM-Hepatitis B Vaccine 03/25/2064 (Originally [birth year]/22/1994)     Health Maintenance Topics with due status: Not Due       Topic Last Completion Date    IMM DTaP/Tdap/Td 02/26/2021    Colon Cancer Screening [facility] 12/03/2021    Depression Screen Yearly 03/26/2023     Health Maintenance Topics with due status: Completed       Topic Last Completion Date    HIV Screening [facility] 05/08/2021    Hepatitis C Screening [facility]/[place] 05/08/2021     Health Maintenance Topics with due status: Aged [employer] Date Due    IMM-HIB 0-5 Yrs or At-Risk Patients Aged Out    IMM-HPV 9-26 Yrs or Shared Decision (27-45 Yrs) Aged Out    IMM-MCV4 0-18 Yrs or At-Risk Patients Aged Out    IMM-Rotavirus 0-8 Months Aged Out    IMM Pneumo: Peds (0-48yrs) or At-Risk Patients (6-59yrs) Aged Out    IMM-MenB (2 Plans: Shared decision & Increased Risk Plans) Aged Out     Review of Systems:    Review of Systems   Constitutional:  Negative for chills, fever, malaise/fatigue and weight loss.   HENT:  Negative for ear pain and hearing loss.    Eyes:  Negative for blurred vision, double vision and pain.   Respiratory:  Negative for cough, sputum production, shortness of breath and wheezing.    Cardiovascular:  Negative for chest pain, palpitations and leg swelling.   Gastrointestinal:  Negative for abdominal pain, blood in stool, constipation, diarrhea, melena, nausea and vomiting.   Genitourinary:  Negative for dysuria, frequency and urgency.   Musculoskeletal:  Negative for back pain, joint pain and neck pain.   Skin:  Negative for rash.   Neurological:  Negative for  dizziness, tingling, sensory change and loss of consciousness.   Psychiatric/Behavioral:  Negative for depression. The patient is not  nervous/anxious.        Physical Exam:   Vital Signs:   Vitals:    03/26/23 1508   BP: 121/77   BP Location: Left arm   Patient Position: Sitting   Pulse: 78   Resp: 18   Temp: 36.6 C (97.9 F)   TempSrc: Temporal   SpO2: 98%   Weight: 65.8 kg (145 lb)   Height: 1.651 m (5\' 5" )     Body mass index is 24.13 kg/m.    Physical Exam  Vitals and nursing note reviewed.   Constitutional:       General: He is not in acute distress.     Appearance: Normal appearance. He is well-developed. He is not ill-appearing, toxic-appearing or diaphoretic.   HENT:      Head: Normocephalic and atraumatic.      Right Ear: Tympanic membrane, ear canal and external ear normal. There is no impacted cerumen.      Left Ear: Tympanic membrane, ear canal and external ear normal. There is no impacted cerumen.      Nose: Nose normal. No congestion or rhinorrhea.      Mouth/Throat:      Mouth: Mucous membranes are moist.      Pharynx: Oropharynx is clear. No oropharyngeal exudate or posterior oropharyngeal erythema.   Eyes:      General: No scleral icterus.        Right eye: No discharge.         Left eye: No discharge.      Conjunctiva/sclera: Conjunctivae normal.      Pupils: Pupils are equal, round, and reactive to light.   Neck:      Thyroid: No thyromegaly.   Cardiovascular:      Rate and Rhythm: Normal rate and regular rhythm.      Heart sounds: Normal heart sounds. No murmur heard.     No friction rub. No gallop.   Pulmonary:      Effort: Pulmonary effort is normal. No respiratory distress.      Breath sounds: Normal breath sounds. No stridor. No wheezing, rhonchi or rales.   Abdominal:      General: Bowel sounds are normal. There is no distension.      Palpations: Abdomen is soft. There is no mass.      Tenderness: There is no abdominal tenderness. There is no guarding or rebound.      Hernia: No hernia is present.   Musculoskeletal:         General: Normal range of motion.      Cervical back: Normal range of motion and neck supple. No  tenderness.      Right lower leg: No edema.      Left lower leg: No edema.   Lymphadenopathy:      Cervical: No cervical adenopathy.   Skin:     General: Skin is warm and dry.      Coloration: Skin is not jaundiced.   Neurological:      General: No focal deficit present.      Mental Status: He is alert and oriented to person, place, and time.      Motor: No weakness or abnormal muscle tone.      Coordination: Coordination normal.   Psychiatric:         Behavior: Behavior normal.  Thought Content: Thought content normal.         Assessment and Plan:   Austin Rice  was seen today for annual exam.    1. Annual physical exam    2. Hyperlipidemia, unspecified hyperlipidemia type -Continue rosuvastatin 20 mg daily.  Update lipid panel at this time.  - rosuvastatin (CRESTOR) 20 mg tablet; Take 1 tablet (20 mg total) by mouth daily.  Dispense: 90 tablet; Refill: 3  - Lipid Panel (Reflex to Direct  LDL if Triglycerides more than 400); Future  - Basic metabolic panel; Future    3. Gastroesophageal reflux disease, unspecified whether esophagitis present -continue omeprazole and/or famotidine as needed.    4. Prediabetes  - Hemoglobin A1c; Future    Flu shot given today.    Counseling performed/Health Maintenance discussed with patient:  -Lab results discussed  -Discussed NORMAL Body mass index is 24.13 kg/m.  -Smoking cessation discussed - denies smoking.  -Discussed age and sex appropriate recommendations for alcohol consumption  -Recreational drug use discussed - denies use.   -Depression screening evaluation performed today - negative PHQ-2.   -Skin CA awareness/prevention discussed   -Encouraged regular vision exams and twice yearly dental examinations   -Motor vehicle safety discussed   -Health care proxy/Advance directives discussed   -Immunizations: reviewed as above.  -Colon Cancer Screening (45-75): up to date.   -DEXA - not indicated.    -Lung cancer screening (current smokers or have quit within the past  15 years aged 46-79 with 20 pack year history or greater): not idnicated.   -AAA screening - men >65 who have ever smoked - not indicated.   -Discussed current recommendations around PSA screening (ages 29 through 19). We have had a detailed informed discussion on benefits and risks of  PSA prostate cancer screening: -not indicated.     Follow-up: Follow up in about 1 year (around 03/25/2024) for Annual exam.    Colbert Ewing, MD  [facility] [place]  03/26/23  3:26 PM

## 2023-03-27 ENCOUNTER — Encounter: Payer: Self-pay | Admitting: Primary Care

## 2023-03-27 ENCOUNTER — Other Ambulatory Visit
Admission: RE | Admit: 2023-03-27 | Discharge: 2023-03-27 | Disposition: A | Discharge status: Home / Self Care | Payer: PRIVATE HEALTH INSURANCE | Source: Ambulatory Visit | Attending: Primary Care | Admitting: Primary Care

## 2023-03-27 DIAGNOSIS — E785 Hyperlipidemia, unspecified: Secondary | ICD-10-CM | POA: Insufficient documentation

## 2023-03-27 DIAGNOSIS — R7303 Prediabetes: Secondary | ICD-10-CM | POA: Insufficient documentation

## 2023-03-27 LAB — BASIC METABOLIC PANEL
Anion Gap: 9 (ref 7–16)
CO2: 27 mmol/L (ref 20–28)
Calcium: 9.4 mg/dL (ref 8.6–10.2)
Chloride: 103 mmol/L (ref 96–108)
Creatinine: 0.96 mg/dL (ref 0.67–1.17)
Glucose: 91 mg/dL (ref 60–99)
Lab: 15 mg/dL (ref 6–20)
Potassium: 4.4 mmol/L (ref 3.3–5.1)
Sodium: 139 mmol/L (ref 133–145)
eGFR BY CREAT: 97 *

## 2023-03-27 LAB — LIPID PANEL
Chol/HDL Ratio: 2.9
Cholesterol: 162 mg/dL
HDL: 56 mg/dL (ref 40–60)
LDL Calculated: 88 mg/dL
Non HDL Cholesterol: 106 mg/dL
Triglycerides: 98 mg/dL

## 2023-03-27 LAB — HEMOGLOBIN A1C: Hemoglobin A1C: 5.5 %

## 2023-11-07 ENCOUNTER — Encounter: Payer: Self-pay | Admitting: Internal Medicine

## 2024-03-26 ENCOUNTER — Encounter: Payer: PRIVATE HEALTH INSURANCE | Admitting: Primary Care

## 7575-04-29 DEATH — deceased
